# Patient Record
Sex: Female | Born: 1960 | Race: Black or African American | Hispanic: No | Marital: Single | Smoking: Former smoker
Health system: Southern US, Community
[De-identification: ages and names within clinical notes are randomized; demographics above are authoritative.]

## PROBLEM LIST (undated history)

## (undated) DIAGNOSIS — E119 Type 2 diabetes mellitus without complications: Secondary | ICD-10-CM

## (undated) DIAGNOSIS — I10 Essential (primary) hypertension: Secondary | ICD-10-CM

## (undated) HISTORY — PX: ABDOMINAL HYSTERECTOMY: SHX81

## (undated) HISTORY — PX: LYMPHADENECTOMY: SHX15

## (undated) HISTORY — PX: TUBAL LIGATION: SHX77

## (undated) HISTORY — PX: BREAST EXCISIONAL BIOPSY: SUR124

---

## 2017-11-07 ENCOUNTER — Emergency Department
Admission: EM | Admit: 2017-11-07 | Discharge: 2017-11-07 | Disposition: A | Discharge status: Home / Self Care | Payer: BLUE CROSS/BLUE SHIELD | Attending: Emergency Medicine | Admitting: Emergency Medicine

## 2017-11-07 ENCOUNTER — Encounter: Payer: Self-pay | Admitting: Emergency Medicine

## 2017-11-07 ENCOUNTER — Other Ambulatory Visit: Payer: Self-pay

## 2017-11-07 DIAGNOSIS — T161XXA Foreign body in right ear, initial encounter: Secondary | ICD-10-CM

## 2017-11-07 DIAGNOSIS — Y999 Unspecified external cause status: Secondary | ICD-10-CM | POA: Diagnosis not present

## 2017-11-07 DIAGNOSIS — Y929 Unspecified place or not applicable: Secondary | ICD-10-CM | POA: Insufficient documentation

## 2017-11-07 DIAGNOSIS — F17220 Nicotine dependence, chewing tobacco, uncomplicated: Secondary | ICD-10-CM | POA: Insufficient documentation

## 2017-11-07 DIAGNOSIS — Y939 Activity, unspecified: Secondary | ICD-10-CM | POA: Insufficient documentation

## 2017-11-07 DIAGNOSIS — E119 Type 2 diabetes mellitus without complications: Secondary | ICD-10-CM | POA: Insufficient documentation

## 2017-11-07 DIAGNOSIS — X58XXXA Exposure to other specified factors, initial encounter: Secondary | ICD-10-CM | POA: Insufficient documentation

## 2017-11-07 DIAGNOSIS — H9201 Otalgia, right ear: Secondary | ICD-10-CM | POA: Diagnosis present

## 2017-11-07 HISTORY — DX: Type 2 diabetes mellitus without complications: E11.9

## 2017-11-07 NOTE — ED Notes (Signed)
Pt c/o right ear pain today, states she goes to sleep with ear buds every night, states she woke up with one of the rubber pieces missing off the ear bud but does not believe it got stuck in her ear.

## 2017-11-07 NOTE — ED Triage Notes (Signed)
R earache since yesterday. 

## 2017-11-07 NOTE — ED Provider Notes (Signed)
[facility] Emergency Department Provider Note  ____________________________________________  Time seen: Approximately 5:01 PM  I have reviewed the triage vital signs and the nursing notes.   HISTORY  Chief Complaint Otalgia    HPI Madison Simmons is a 57 y.o. female that presents to the emergency department for evaluation of right ear pain for 1 day.  She has been digging in her ear. She had bronchitis about 1 month ago but no other recent illness.  No fever, chills, nasal congestion, cough, shortness of breath.   Past Medical History:  Diagnosis Date  . Diabetes mellitus without complication (HCC)     There are no active problems to display for this patient.   Past Surgical History:  Procedure Laterality Date  . ABDOMINAL HYSTERECTOMY    . lymphectomy    . TUBAL LIGATION      Prior to Admission medications   Not on File    Allergies Patient has no known allergies.  No family history on file.  Social History Social History   Tobacco Use  . Smoking status: Former Games developermoker  . Smokeless tobacco: Current User  Substance Use Topics  . Alcohol use: Not on file  . Drug use: Not on file     Review of Systems  Constitutional: No fever/chills Cardiovascular: No chest pain. Respiratory: No SOB. Gastrointestinal: No nausea, no vomiting.  Musculoskeletal: Negative for musculoskeletal pain. Skin: Negative for rash, abrasions, lacerations, ecchymosis.  ____________________________________________   PHYSICAL EXAM:  VITAL SIGNS: ED Triage Vitals  Enc Vitals Group     BP 11/07/17 1532 (!) 138/58     Pulse Rate 11/07/17 1532 73     Resp 11/07/17 1532 18     Temp 11/07/17 1532 98.4 F (36.9 C)     Temp src --      SpO2 11/07/17 1532 100 %     Weight 11/07/17 1532 277 lb (125.6 kg)     Height 11/07/17 1532 5\' 5"  (1.651 m)     Head Circumference --      Peak Flow --      Pain Score 11/07/17 1531 8     Pain Loc --      Pain Edu? --       Excl. in GC? --      Constitutional: Alert and oriented. Well appearing and in no acute distress. Eyes: Conjunctivae are normal. PERRL. EOMI. Head: Atraumatic. ENT:      Ears: Black item in right ear canal.      Nose: No congestion/rhinnorhea.      Mouth/Throat: Mucous membranes are moist.  Neck: No stridor.   Cardiovascular: Good peripheral circulation. Respiratory: Normal respiratory effort without tachypnea or retractions.  Good air entry to the bases with no decreased or absent breath sounds. Musculoskeletal: Full range of motion to all extremities. No gross deformities appreciated. Neurologic:  Normal speech and language. No gross focal neurologic deficits are appreciated.  Skin:  Skin is warm, dry and intact. No rash noted.   ____________________________________________   LABS (all labs ordered are listed, but only abnormal results are displayed)  Labs Reviewed - No data to display ____________________________________________  EKG   ____________________________________________  RADIOLOGY  No results found.  ____________________________________________    PROCEDURES  Procedure(s) performed:    Procedures  Earbud removed from right ear canal with   Medications - No data to display   ____________________________________________   INITIAL IMPRESSION / ASSESSMENT AND PLAN / ED COURSE  Pertinent labs & imaging results  that were available during my care of the patient were reviewed by me and considered in my medical decision making (see chart for details).  Review of the [place] CSRS was performed in accordance of the [facility] prior to dispensing any controlled drugs.   Patient's diagnosis is consistent with foreign body in ear canal.  Vital signs and exam are reassuring.  No signs of infection after earbud removal.  Patient is to follow up with PCP as directed. Patient is given ED precautions to return to the ED for any worsening or new  symptoms.     ____________________________________________  FINAL CLINICAL IMPRESSION(S) / ED DIAGNOSES  Final diagnoses:  Foreign body of right ear, initial encounter      NEW MEDICATIONS STARTED DURING THIS VISIT:  ED Discharge Orders    None          This chart was dictated using voice recognition software/Dragon. Despite best efforts to proofread, errors can occur which can change the meaning. Any change was purely unintentional.    Enid Derry, PA-C 11/07/17 1722    Jene Every, MD 11/07/17 [phone]

## 2018-01-18 ENCOUNTER — Other Ambulatory Visit: Payer: Self-pay | Admitting: Internal Medicine

## 2018-01-18 DIAGNOSIS — Z1231 Encounter for screening mammogram for malignant neoplasm of breast: Secondary | ICD-10-CM

## 2018-02-09 ENCOUNTER — Ambulatory Visit
Admission: RE | Admit: 2018-02-09 | Discharge: 2018-02-09 | Disposition: A | Discharge status: Home / Self Care | Payer: BLUE CROSS/BLUE SHIELD | Source: Ambulatory Visit | Attending: Internal Medicine | Admitting: Internal Medicine

## 2018-02-09 DIAGNOSIS — Z1231 Encounter for screening mammogram for malignant neoplasm of breast: Secondary | ICD-10-CM | POA: Diagnosis present

## 2018-02-10 ENCOUNTER — Inpatient Hospital Stay
Admission: RE | Admit: 2018-02-10 | Discharge: 2018-02-10 | Disposition: A | Discharge status: Home / Self Care | Payer: Self-pay | Source: Ambulatory Visit | Attending: *Deleted | Admitting: *Deleted

## 2018-02-10 ENCOUNTER — Other Ambulatory Visit: Payer: Self-pay | Admitting: *Deleted

## 2018-02-10 DIAGNOSIS — Z9289 Personal history of other medical treatment: Secondary | ICD-10-CM

## 2019-02-04 ENCOUNTER — Other Ambulatory Visit: Payer: Self-pay

## 2019-02-04 ENCOUNTER — Emergency Department
Admission: EM | Admit: 2019-02-04 | Discharge: 2019-02-04 | Disposition: A | Discharge status: Home / Self Care | Payer: BLUE CROSS/BLUE SHIELD | Attending: Student in an Organized Health Care Education/Training Program | Admitting: Student in an Organized Health Care Education/Training Program

## 2019-02-04 ENCOUNTER — Emergency Department: Payer: BLUE CROSS/BLUE SHIELD

## 2019-02-04 ENCOUNTER — Encounter: Payer: Self-pay | Admitting: Emergency Medicine

## 2019-02-04 DIAGNOSIS — N61 Mastitis without abscess: Secondary | ICD-10-CM

## 2019-02-04 DIAGNOSIS — F17290 Nicotine dependence, other tobacco product, uncomplicated: Secondary | ICD-10-CM | POA: Insufficient documentation

## 2019-02-04 DIAGNOSIS — E119 Type 2 diabetes mellitus without complications: Secondary | ICD-10-CM | POA: Diagnosis not present

## 2019-02-04 DIAGNOSIS — R509 Fever, unspecified: Secondary | ICD-10-CM | POA: Diagnosis present

## 2019-02-04 DIAGNOSIS — N644 Mastodynia: Secondary | ICD-10-CM

## 2019-02-04 LAB — CBC WITH DIFFERENTIAL/PLATELET
Abs Immature Granulocytes: 0.05 10*3/uL (ref 0.00–0.07)
Basophils Absolute: 0 10*3/uL (ref 0.0–0.1)
Basophils Relative: 0 %
Eosinophils Absolute: 0 10*3/uL (ref 0.0–0.5)
Eosinophils Relative: 0 %
HCT: 34.1 % — ABNORMAL LOW (ref 36.0–46.0)
Hemoglobin: 10.8 g/dL — ABNORMAL LOW (ref 12.0–15.0)
Immature Granulocytes: 0 %
Lymphocytes Relative: 9 %
Lymphs Abs: 1 10*3/uL (ref 0.7–4.0)
MCH: 26.9 pg (ref 26.0–34.0)
MCHC: 31.7 g/dL (ref 30.0–36.0)
MCV: 84.8 fL (ref 80.0–100.0)
Monocytes Absolute: 0.6 10*3/uL (ref 0.1–1.0)
Monocytes Relative: 5 %
Neutro Abs: 9.5 10*3/uL — ABNORMAL HIGH (ref 1.7–7.7)
Neutrophils Relative %: 86 %
Platelets: 303 10*3/uL (ref 150–400)
RBC: 4.02 MIL/uL (ref 3.87–5.11)
RDW: 13.5 % (ref 11.5–15.5)
WBC: 11.2 10*3/uL — ABNORMAL HIGH (ref 4.0–10.5)
nRBC: 0 % (ref 0.0–0.2)

## 2019-02-04 LAB — COMPREHENSIVE METABOLIC PANEL
ALT: 13 U/L (ref 0–44)
AST: 26 U/L (ref 15–41)
Albumin: 3.1 g/dL — ABNORMAL LOW (ref 3.5–5.0)
Alkaline Phosphatase: 53 U/L (ref 38–126)
Anion gap: 10 (ref 5–15)
BUN: 10 mg/dL (ref 6–20)
CO2: 22 mmol/L (ref 22–32)
Calcium: 8 mg/dL — ABNORMAL LOW (ref 8.9–10.3)
Chloride: 104 mmol/L (ref 98–111)
Creatinine, Ser: 0.79 mg/dL (ref 0.44–1.00)
GFR calc Af Amer: >60 mL/min (ref >60)
GFR calc non Af Amer: >60 mL/min (ref >60)
Glucose, Bld: 306 mg/dL — ABNORMAL HIGH (ref 70–99)
Potassium: 4.1 mmol/L (ref 3.5–5.1)
Sodium: 136 mmol/L (ref 135–145)
Total Bilirubin: 1 mg/dL (ref 0.3–1.2)
Total Protein: 7 g/dL (ref 6.5–8.1)

## 2019-02-04 MED ORDER — TRAMADOL HCL 50 MG PO TABS: 50.0000 mg | ORAL_TABLET | Freq: Four times a day (QID) | ORAL | 0 refills | Status: AC | PRN

## 2019-02-04 MED ORDER — AMOXICILLIN-POT CLAVULANATE 875-125 MG PO TABS
1.0000 | ORAL_TABLET | Freq: Once | ORAL | Status: AC
  Administered 2019-02-04: 1 via ORAL
  Filled 2019-02-04: qty 1

## 2019-02-04 MED ORDER — AMOXICILLIN-POT CLAVULANATE 875-125 MG PO TABS: 1.0000 | ORAL_TABLET | Freq: Two times a day (BID) | ORAL | 0 refills | Status: AC

## 2019-02-04 NOTE — ED Notes (Signed)
Pt ambulatory to toilet with steady gait noted.  

## 2019-02-04 NOTE — ED Notes (Signed)
US at bedside

## 2019-02-04 NOTE — ED Notes (Signed)
X-ray at bedside

## 2019-02-04 NOTE — ED Triage Notes (Signed)
Pt presents to ED via POV with c/o fever (max temp at home 100.8) cough, chills, HA, and L breast pain, pt states hx of mastitis, states feels the same.

## 2019-02-04 NOTE — ED Provider Notes (Signed)
[facility] Emergency Department Provider Note    First MD Initiated Contact with Patient 02/04/19 1317     (approximate)  I have reviewed the triage vital signs and the nursing notes.   HISTORY  Chief Complaint Fever; Headache; Cough; and Breast Pain    HPI Madison Simmons is a 58 y.o. female with a history of diabetes as well as a history of mastitis of the left breast 1 year ago presents the ER with flulike illness including cough achiness congestion mild headache started on Friday or about the same time she started developing redness around her left breast with mild to moderate pain.  No recent antibiotics.  Is having fevers and chills at home.  No known sick contacts.  Did not have a previous abscess of the left breast.  Nuys any drainage or bleeding from the nipple.    Past Medical History:  Diagnosis Date  . Diabetes mellitus without complication (HCC)    Family History  Problem Relation Age of Onset  . Breast cancer Mother        58's  . Breast cancer Maternal Aunt        80's   Past Surgical History:  Procedure Laterality Date  . ABDOMINAL HYSTERECTOMY    . LYMPHADENECTOMY Left    Around 1995 Neg  . TUBAL LIGATION     There are no active problems to display for this patient.     Prior to Admission medications   Medication Sig Start Date End Date Taking? Authorizing Provider  amoxicillin-clavulanate (AUGMENTIN) 875-125 MG tablet Take 1 tablet by mouth 2 (two) times daily for 7 days. 02/04/19 02/11/19  Willy Eddy, MD  traMADol (ULTRAM) 50 MG tablet Take 1 tablet (50 mg total) by mouth every 6 (six) hours as needed. 02/04/19 02/04/20  Willy Eddy, MD    Allergies Patient has no known allergies.    Social History Social History   Tobacco Use  . Smoking status: Former Smoker    Types: Cigarettes  . Smokeless tobacco: Current User    Types: Snuff  Substance Use Topics  . Alcohol use: Yes    Comment: occ  . Drug use:  Not Currently    Review of Systems Patient denies headaches, rhinorrhea, blurry vision, numbness, shortness of breath, chest pain, edema, cough, abdominal pain, nausea, vomiting, diarrhea, dysuria, fevers, rashes or hallucinations unless otherwise stated above in HPI. ____________________________________________   PHYSICAL EXAM:  VITAL SIGNS: Vitals:   02/04/19 1310  BP: 117/78  Pulse: 78  Resp: 18  Temp: 99.1 F (37.3 C)  SpO2: 98%    Constitutional: Alert and oriented.  Eyes: Conjunctivae are normal.  Head: Atraumatic. Nose: No congestion/rhinnorhea. Mouth/Throat: Mucous membranes are moist.   Neck: No stridor. Painless ROM.  Cardiovascular: Normal rate, regular rhythm. Grossly normal heart sounds.  Good peripheral circulation. Respiratory: Normal respiratory effort.  No retractions. Lungs CTAB. Gastrointestinal: Soft and nontender. No distention. No abdominal bruits. No CVA tenderness. Genitourinary:  Musculoskeletal: No lower extremity tenderness nor edema.  No joint effusions. Neurologic:  Normal speech and language. No gross focal neurologic deficits are appreciated. No facial droop Skin:  Skin is warm, dry and intact.  Left breast has a tender area with some fluctuance at around 1100 with surrounding streaking erythema.  No drainage. Psychiatric: Mood and affect are normal. Speech and behavior are normal.  ____________________________________________   LABS (all labs ordered are listed, but only abnormal results are displayed)  Results for orders placed or  performed during the hospital encounter of 02/04/19 (from the past 24 hour(s))  CBC with Differential/Platelet     Status: Abnormal   Collection Time: 02/04/19  1:47 PM  Result Value Ref Range   WBC 11.2 (H) 4.0 - 10.5 K/uL   RBC 4.02 3.87 - 5.11 MIL/uL   Hemoglobin 10.8 (L) 12.0 - 15.0 g/dL   HCT 70.6 (L) 23.7 - 62.8 %   MCV 84.8 80.0 - 100.0 fL   MCH 26.9 26.0 - 34.0 pg   MCHC 31.7 30.0 - 36.0 g/dL   RDW  31.5 17.6 - 16.0 %   Platelets 303 150 - 400 K/uL   nRBC 0.0 0.0 - 0.2 %   Neutrophils Relative % 86 %   Neutro Abs 9.5 (H) 1.7 - 7.7 K/uL   Lymphocytes Relative 9 %   Lymphs Abs 1.0 0.7 - 4.0 K/uL   Monocytes Relative 5 %   Monocytes Absolute 0.6 0.1 - 1.0 K/uL   Eosinophils Relative 0 %   Eosinophils Absolute 0.0 0.0 - 0.5 K/uL   Basophils Relative 0 %   Basophils Absolute 0.0 0.0 - 0.1 K/uL   Immature Granulocytes 0 %   Abs Immature Granulocytes 0.05 0.00 - 0.07 K/uL  Comprehensive metabolic panel     Status: Abnormal   Collection Time: 02/04/19  1:47 PM  Result Value Ref Range   Sodium 136 135 - 145 mmol/L   Potassium 4.1 3.5 - 5.1 mmol/L   Chloride 104 98 - 111 mmol/L   CO2 22 22 - 32 mmol/L   Glucose, Bld 306 (H) 70 - 99 mg/dL   BUN 10 6 - 20 mg/dL   Creatinine, Ser 7.37 0.44 - 1.00 mg/dL   Calcium 8.0 (L) 8.9 - 10.3 mg/dL   Total Protein 7.0 6.5 - 8.1 g/dL   Albumin 3.1 (L) 3.5 - 5.0 g/dL   AST 26 15 - 41 U/L   ALT 13 0 - 44 U/L   Alkaline Phosphatase 53 38 - 126 U/L   Total Bilirubin 1.0 0.3 - 1.2 mg/dL   GFR calc non Af Amer >60 >60 mL/min   GFR calc Af Amer >60 >60 mL/min   Anion gap 10 5 - 15   ____________________________________________ ___________________________________  RADIOLOGY  I personally reviewed all radiographic images ordered to evaluate for the above acute complaints and reviewed radiology reports and findings.  These findings were personally discussed with the patient.  Please see medical record for radiology report.  ____________________________________________   PROCEDURES  Procedure(s) performed:  Procedures    Critical Care performed: no ____________________________________________   INITIAL IMPRESSION / ASSESSMENT AND PLAN / ED COURSE  Pertinent labs & imaging results that were available during my care of the patient were reviewed by me and considered in my medical decision making (see chart for details).   DDX: abscess,  cellulitis, ili, covid 19, sepsis, electrolyte, pna  Madison Simmons is a 58 y.o. who presents to the ED with symptoms as described above.  Patient nontoxic-appearing.  Exam performed with nurse Victorino Dike chaperone.  Shows evidence of acute mastitis of the left breast.  Ultrasound ordered to evaluate for underlying abscess.  There is no evidence of abscess or fluid collection on ultrasound.  Blood work is fairly reassuring.  At this point do believe she stable and appropriate for outpatient follow-up.  She tolerated oral antibiotics.  Discussed signs and symptoms for which she should return to the ER.     The patient was evaluated in  Emergency Department today for the symptoms described in the history of present illness. He/she was evaluated in the context of the global COVID-19 pandemic, which necessitated consideration that the patient might be at risk for infection with the SARS-CoV-2 virus that causes COVID-19. Institutional protocols and algorithms that pertain to the evaluation of patients at risk for COVID-19 are in a state of rapid change based on information released by regulatory bodies including the CDC and federal and state organizations. These policies and algorithms were followed during the patient's care in the ED.  As part of my medical decision making, I reviewed the following data within the electronic MEDICAL RECORD NUMBER Nursing notes reviewed and incorporated, Labs reviewed, notes from prior ED visits and [place] Controlled Substance Database   ____________________________________________   FINAL CLINICAL IMPRESSION(S) / ED DIAGNOSES  Final diagnoses:  Breast pain in female  Mastitis, left, acute      NEW MEDICATIONS STARTED DURING THIS VISIT:  New Prescriptions   AMOXICILLIN-CLAVULANATE (AUGMENTIN) 875-125 MG TABLET    Take 1 tablet by mouth 2 (two) times daily for 7 days.   TRAMADOL (ULTRAM) 50 MG TABLET    Take 1 tablet (50 mg total) by mouth every 6 (six) hours as needed.      Note:  This document was prepared using Dragon voice recognition software and may include unintentional dictation errors.    Willy Eddy, MD 02/04/19 [phone]

## 2019-04-17 ENCOUNTER — Other Ambulatory Visit: Payer: Self-pay | Admitting: Internal Medicine

## 2019-04-17 DIAGNOSIS — Z1231 Encounter for screening mammogram for malignant neoplasm of breast: Secondary | ICD-10-CM

## 2019-05-23 ENCOUNTER — Ambulatory Visit
Admission: RE | Admit: 2019-05-23 | Discharge: 2019-05-23 | Disposition: A | Discharge status: Home / Self Care | Payer: BC Managed Care – PPO | Source: Ambulatory Visit | Attending: Internal Medicine | Admitting: Internal Medicine

## 2019-05-23 DIAGNOSIS — Z1231 Encounter for screening mammogram for malignant neoplasm of breast: Secondary | ICD-10-CM | POA: Diagnosis present

## 2019-11-21 ENCOUNTER — Other Ambulatory Visit: Payer: Self-pay

## 2019-11-21 ENCOUNTER — Encounter: Payer: Self-pay | Admitting: Emergency Medicine

## 2019-11-21 ENCOUNTER — Emergency Department: Payer: BC Managed Care – PPO

## 2019-11-21 ENCOUNTER — Emergency Department
Admission: EM | Admit: 2019-11-21 | Discharge: 2019-11-21 | Disposition: A | Discharge status: Home / Self Care | Payer: BC Managed Care – PPO | Attending: Emergency Medicine | Admitting: Emergency Medicine

## 2019-11-21 DIAGNOSIS — Z87891 Personal history of nicotine dependence: Secondary | ICD-10-CM | POA: Diagnosis not present

## 2019-11-21 DIAGNOSIS — E119 Type 2 diabetes mellitus without complications: Secondary | ICD-10-CM | POA: Diagnosis not present

## 2019-11-21 DIAGNOSIS — R079 Chest pain, unspecified: Secondary | ICD-10-CM

## 2019-11-21 DIAGNOSIS — R0789 Other chest pain: Secondary | ICD-10-CM | POA: Diagnosis not present

## 2019-11-21 LAB — CBC
HCT: 38.1 % (ref 36.0–46.0)
Hemoglobin: 12.1 g/dL (ref 12.0–15.0)
MCH: 27 pg (ref 26.0–34.0)
MCHC: 31.8 g/dL (ref 30.0–36.0)
MCV: 85 fL (ref 80.0–100.0)
Platelets: 360 10*3/uL (ref 150–400)
RBC: 4.48 MIL/uL (ref 3.87–5.11)
RDW: 13.7 % (ref 11.5–15.5)
WBC: 6.9 10*3/uL (ref 4.0–10.5)
nRBC: 0 % (ref 0.0–0.2)

## 2019-11-21 LAB — TROPONIN I (HIGH SENSITIVITY)
Troponin I (High Sensitivity): 3 ng/L (ref <18)
Troponin I (High Sensitivity): 3 ng/L (ref <18)

## 2019-11-21 LAB — BASIC METABOLIC PANEL
Anion gap: 11 (ref 5–15)
BUN: 18 mg/dL (ref 6–20)
CO2: 24 mmol/L (ref 22–32)
Calcium: 9.3 mg/dL (ref 8.9–10.3)
Chloride: 101 mmol/L (ref 98–111)
Creatinine, Ser: 0.88 mg/dL (ref 0.44–1.00)
GFR calc Af Amer: >60 mL/min (ref >60)
GFR calc non Af Amer: >60 mL/min (ref >60)
Glucose, Bld: 268 mg/dL — ABNORMAL HIGH (ref 70–99)
Potassium: 4.5 mmol/L (ref 3.5–5.1)
Sodium: 136 mmol/L (ref 135–145)

## 2019-11-21 NOTE — ED Provider Notes (Signed)
[facility] Emergency Department Provider Note  Time seen: 7:07 PM  I have reviewed the triage vital signs and the nursing notes.   HISTORY  Chief Complaint Chest Pain   HPI Madison Simmons is a 59 y.o. female with a past medical history of diabetes who presents to the emergency department for chest pain.  According to the patient since yesterday she has been experiencing intermittent pinches of pain in the center of her chest.  States they will only last seconds at a time and then go away.  Patient denies any nausea diaphoresis or shortness of breath.  Denies any cough congestion or fever.  Patient denies any chest pain currently.  No pleuritic pain.    Past Medical History:  Diagnosis Date  . Diabetes mellitus without complication (HCC)     There are no problems to display for this patient.   Past Surgical History:  Procedure Laterality Date  . ABDOMINAL HYSTERECTOMY    . BREAST EXCISIONAL BIOPSY    . LYMPHADENECTOMY Left    Around 1995 Neg  . TUBAL LIGATION      Prior to Admission medications   Medication Sig Start Date End Date Taking? Authorizing Provider  traMADol (ULTRAM) 50 MG tablet Take 1 tablet (50 mg total) by mouth every 6 (six) hours as needed. 02/04/19 02/04/20  Willy Eddy, MD    No Known Allergies  Family History  Problem Relation Age of Onset  . Breast cancer Mother        48's  . Breast cancer Maternal Aunt        28's    Social History Social History   Tobacco Use  . Smoking status: Former Smoker    Types: Cigarettes  . Smokeless tobacco: Current User    Types: Snuff  Substance Use Topics  . Alcohol use: Yes    Comment: occ  . Drug use: Not Currently    Review of Systems Constitutional: Negative for fever Cardiovascular: Intermittent sharp chest pain x2 days, none currently Respiratory: Negative for shortness of breath. Gastrointestinal: Negative for abdominal pain Musculoskeletal: Negative for  musculoskeletal complaints Neurological: Negative for headache All other ROS negative  ____________________________________________   PHYSICAL EXAM:  VITAL SIGNS: ED Triage Vitals [11/21/19 1511]  Enc Vitals Group     BP (!) 144/77     Pulse Rate (!) 54     Resp 16     Temp 98.5 F (36.9 C)     Temp Source Oral     SpO2 98 %     Weight 283 lb (128.4 kg)     Height 5\' 5"  (1.651 m)     Head Circumference      Peak Flow      Pain Score 7     Pain Loc      Pain Edu?      Excl. in GC?    Constitutional: Alert and oriented. Well appearing and in no distress. Eyes: Normal exam ENT      Head: Normocephalic and atraumatic.      Mouth/Throat: Mucous membranes are moist. Cardiovascular: Normal rate, regular rhythm. No murmur Respiratory: Normal respiratory effort without tachypnea nor retractions. Breath sounds are clear  Gastrointestinal: Soft and nontender. No distention. Musculoskeletal: Nontender with normal range of motion in all extremities. Neurologic:  Normal speech and language. No gross focal neurologic deficits  Skin:  Skin is warm, dry and intact.  Psychiatric: Mood and affect are normal.   ____________________________________________    EKG  EKG viewed and interpreted by myself shows a sinus bradycardia 58 bpm with a narrow QRS, normal axis, normal intervals, no concerning ST changes.  ____________________________________________    RADIOLOGY  Chest x-ray is negative.  ____________________________________________   INITIAL IMPRESSION / ASSESSMENT AND PLAN / ED COURSE  Pertinent labs & imaging results that were available during my care of the patient were reviewed by me and considered in my medical decision making (see chart for details).   Patient presents to the emergency department for intermittent pinches of chest pain to the center of her chest.  No chest pain currently.  Overall the patient appears well, normal physical exam, reassuring EKG, chest  x-ray is negative.  Lab work is reassuring as well with a negative troponin.  Mild hyperglycemia for which the patient takes glyburide.  Given a negative troponin x-ray and reassuring EKG with symptoms ongoing intermittently for 2 days and symptom-free currently, we will discharge with PCP follow-up.  Patient agreeable to plan of care.  Provided my normal chest pain return precautions.  Madison Simmons was evaluated in Emergency Department on 11/21/2019 for the symptoms described in the history of present illness. She was evaluated in the context of the global COVID-19 pandemic, which necessitated consideration that the patient might be at risk for infection with the SARS-CoV-2 virus that causes COVID-19. Institutional protocols and algorithms that pertain to the evaluation of patients at risk for COVID-19 are in a state of rapid change based on information released by regulatory bodies including the CDC and federal and state organizations. These policies and algorithms were followed during the patient's care in the ED.  ____________________________________________   FINAL CLINICAL IMPRESSION(S) / ED DIAGNOSES  Nonspecific chest pain   Harvest Dark, MD 11/21/19 1913

## 2019-11-21 NOTE — ED Triage Notes (Addendum)
Patient reports intermittent chest pain since yesterday. States this morning when she got up for work, the pain was significantly worse. Patient denies dizziness or SOB.   Patient said she was had a positive COVID result on 12/21

## 2019-11-29 LAB — HM MAMMOGRAPHY

## 2020-08-06 ENCOUNTER — Other Ambulatory Visit: Payer: Self-pay | Admitting: Internal Medicine

## 2020-08-06 DIAGNOSIS — Z1231 Encounter for screening mammogram for malignant neoplasm of breast: Secondary | ICD-10-CM

## 2020-09-03 ENCOUNTER — Other Ambulatory Visit: Payer: Self-pay

## 2020-09-03 ENCOUNTER — Ambulatory Visit
Admission: RE | Admit: 2020-09-03 | Discharge: 2020-09-03 | Disposition: A | Discharge status: Home / Self Care | Payer: No Typology Code available for payment source | Source: Ambulatory Visit | Attending: Internal Medicine | Admitting: Internal Medicine

## 2020-09-03 DIAGNOSIS — Z1231 Encounter for screening mammogram for malignant neoplasm of breast: Secondary | ICD-10-CM | POA: Diagnosis not present

## 2021-06-24 ENCOUNTER — Other Ambulatory Visit: Payer: Self-pay | Admitting: Internal Medicine

## 2021-06-24 DIAGNOSIS — Z1231 Encounter for screening mammogram for malignant neoplasm of breast: Secondary | ICD-10-CM

## 2021-09-07 ENCOUNTER — Other Ambulatory Visit: Payer: Self-pay

## 2021-09-07 ENCOUNTER — Ambulatory Visit
Admission: RE | Admit: 2021-09-07 | Discharge: 2021-09-07 | Disposition: A | Discharge status: Home / Self Care | Payer: BLUE CROSS/BLUE SHIELD | Source: Ambulatory Visit | Attending: Internal Medicine | Admitting: Internal Medicine

## 2021-09-07 DIAGNOSIS — Z1231 Encounter for screening mammogram for malignant neoplasm of breast: Secondary | ICD-10-CM

## 2021-11-23 DIAGNOSIS — K219 Gastro-esophageal reflux disease without esophagitis: Secondary | ICD-10-CM | POA: Diagnosis not present

## 2021-11-23 DIAGNOSIS — I10 Essential (primary) hypertension: Secondary | ICD-10-CM | POA: Diagnosis not present

## 2021-11-23 DIAGNOSIS — E7800 Pure hypercholesterolemia, unspecified: Secondary | ICD-10-CM | POA: Diagnosis not present

## 2021-11-23 DIAGNOSIS — E1165 Type 2 diabetes mellitus with hyperglycemia: Secondary | ICD-10-CM | POA: Diagnosis not present

## 2021-11-24 DIAGNOSIS — K219 Gastro-esophageal reflux disease without esophagitis: Secondary | ICD-10-CM | POA: Diagnosis not present

## 2021-11-24 DIAGNOSIS — E7800 Pure hypercholesterolemia, unspecified: Secondary | ICD-10-CM | POA: Diagnosis not present

## 2021-11-24 DIAGNOSIS — I10 Essential (primary) hypertension: Secondary | ICD-10-CM | POA: Diagnosis not present

## 2021-11-24 DIAGNOSIS — E1165 Type 2 diabetes mellitus with hyperglycemia: Secondary | ICD-10-CM | POA: Diagnosis not present

## 2021-11-30 DIAGNOSIS — Z0000 Encounter for general adult medical examination without abnormal findings: Secondary | ICD-10-CM | POA: Diagnosis not present

## 2021-11-30 DIAGNOSIS — M79622 Pain in left upper arm: Secondary | ICD-10-CM | POA: Diagnosis not present

## 2021-11-30 DIAGNOSIS — I10 Essential (primary) hypertension: Secondary | ICD-10-CM | POA: Diagnosis not present

## 2022-03-11 DIAGNOSIS — Z86010 Personal history of colonic polyps: Secondary | ICD-10-CM | POA: Diagnosis not present

## 2022-03-18 DIAGNOSIS — E7800 Pure hypercholesterolemia, unspecified: Secondary | ICD-10-CM | POA: Diagnosis not present

## 2022-03-18 DIAGNOSIS — I10 Essential (primary) hypertension: Secondary | ICD-10-CM | POA: Diagnosis not present

## 2022-03-18 DIAGNOSIS — M79622 Pain in left upper arm: Secondary | ICD-10-CM | POA: Diagnosis not present

## 2022-03-18 DIAGNOSIS — R69 Illness, unspecified: Secondary | ICD-10-CM | POA: Diagnosis not present

## 2022-03-18 DIAGNOSIS — Z0000 Encounter for general adult medical examination without abnormal findings: Secondary | ICD-10-CM | POA: Diagnosis not present

## 2022-03-18 DIAGNOSIS — Z6841 Body Mass Index (BMI) 40.0 and over, adult: Secondary | ICD-10-CM | POA: Diagnosis not present

## 2022-03-18 DIAGNOSIS — E1165 Type 2 diabetes mellitus with hyperglycemia: Secondary | ICD-10-CM | POA: Diagnosis not present

## 2022-04-01 ENCOUNTER — Other Ambulatory Visit: Payer: Self-pay | Admitting: Internal Medicine

## 2022-04-01 ENCOUNTER — Ambulatory Visit
Admission: RE | Admit: 2022-04-01 | Discharge: 2022-04-01 | Disposition: A | Discharge status: Home / Self Care | Payer: 59 | Source: Ambulatory Visit | Attending: Internal Medicine | Admitting: Internal Medicine

## 2022-04-01 DIAGNOSIS — K219 Gastro-esophageal reflux disease without esophagitis: Secondary | ICD-10-CM | POA: Diagnosis not present

## 2022-04-01 DIAGNOSIS — M7989 Other specified soft tissue disorders: Secondary | ICD-10-CM | POA: Insufficient documentation

## 2022-04-01 DIAGNOSIS — M79605 Pain in left leg: Secondary | ICD-10-CM | POA: Diagnosis not present

## 2022-04-01 DIAGNOSIS — Z6841 Body Mass Index (BMI) 40.0 and over, adult: Secondary | ICD-10-CM | POA: Diagnosis not present

## 2022-04-01 DIAGNOSIS — I83813 Varicose veins of bilateral lower extremities with pain: Secondary | ICD-10-CM | POA: Diagnosis not present

## 2022-04-01 DIAGNOSIS — I10 Essential (primary) hypertension: Secondary | ICD-10-CM | POA: Diagnosis not present

## 2022-04-01 DIAGNOSIS — Z720 Tobacco use: Secondary | ICD-10-CM | POA: Diagnosis not present

## 2022-04-01 DIAGNOSIS — R69 Illness, unspecified: Secondary | ICD-10-CM | POA: Diagnosis not present

## 2022-04-01 DIAGNOSIS — M79672 Pain in left foot: Secondary | ICD-10-CM | POA: Diagnosis not present

## 2022-04-29 DIAGNOSIS — H40003 Preglaucoma, unspecified, bilateral: Secondary | ICD-10-CM | POA: Diagnosis not present

## 2022-06-11 ENCOUNTER — Ambulatory Visit: Admission: RE | Admit: 2022-06-11 | Payer: 59 | Source: Home / Self Care

## 2022-06-11 ENCOUNTER — Encounter: Admission: RE | Payer: Self-pay | Source: Home / Self Care

## 2022-06-11 SURGERY — COLONOSCOPY WITH PROPOFOL
Anesthesia: General

## 2022-07-28 ENCOUNTER — Other Ambulatory Visit: Payer: Self-pay | Admitting: Internal Medicine

## 2022-07-28 DIAGNOSIS — Z1231 Encounter for screening mammogram for malignant neoplasm of breast: Secondary | ICD-10-CM

## 2022-09-09 ENCOUNTER — Ambulatory Visit
Admission: RE | Admit: 2022-09-09 | Discharge: 2022-09-09 | Disposition: A | Discharge status: Home / Self Care | Payer: 59 | Source: Ambulatory Visit | Attending: Internal Medicine | Admitting: Internal Medicine

## 2022-09-09 DIAGNOSIS — Z1231 Encounter for screening mammogram for malignant neoplasm of breast: Secondary | ICD-10-CM

## 2022-10-11 ENCOUNTER — Emergency Department
Admission: EM | Admit: 2022-10-11 | Discharge: 2022-10-11 | Disposition: A | Discharge status: Home / Self Care | Payer: 59 | Attending: Emergency Medicine | Admitting: Emergency Medicine

## 2022-10-11 ENCOUNTER — Other Ambulatory Visit: Payer: Self-pay

## 2022-10-11 DIAGNOSIS — J101 Influenza due to other identified influenza virus with other respiratory manifestations: Secondary | ICD-10-CM | POA: Diagnosis not present

## 2022-10-11 DIAGNOSIS — R059 Cough, unspecified: Secondary | ICD-10-CM | POA: Diagnosis present

## 2022-10-11 DIAGNOSIS — Z1152 Encounter for screening for COVID-19: Secondary | ICD-10-CM | POA: Insufficient documentation

## 2022-10-11 LAB — RESP PANEL BY RT-PCR (FLU A&B, COVID) ARPGX2
Influenza A by PCR: POSITIVE — AB
Influenza B by PCR: NEGATIVE
SARS Coronavirus 2 by RT PCR: NEGATIVE

## 2022-10-11 MED ORDER — OSELTAMIVIR PHOSPHATE 75 MG PO CAPS: 75.0000 mg | ORAL_CAPSULE | Freq: Two times a day (BID) | ORAL | 0 refills | Status: AC

## 2022-10-11 MED ORDER — BENZONATATE 100 MG PO CAPS: 100.0000 mg | ORAL_CAPSULE | Freq: Four times a day (QID) | ORAL | 0 refills | Status: AC | PRN

## 2022-10-11 NOTE — ED Provider Notes (Signed)
   [facility] Provider Note    Event Date/Time   First MD Initiated Contact with Patient 10/11/22 1025     (approximate)   History   Sore Throat and Cough   HPI  Madison Simmons is a 61 y.o. female who presents with complaints of sore throat, cough, body aches, fatigue, headaches.  She reports the symptoms started yesterday.  No shortness of breath.     Physical Exam   Triage Vital Signs: ED Triage Vitals  Enc Vitals Group     BP 10/11/22 0932 (!) 141/75     Pulse Rate 10/11/22 0932 70     Resp 10/11/22 0932 16     Temp 10/11/22 0936 98.4 F (36.9 C)     Temp Source 10/11/22 0936 Oral     SpO2 10/11/22 0932 97 %     Weight 10/11/22 0934 122 kg (269 lb)     Height 10/11/22 0934 1.651 m (5\' 5" )     Head Circumference --      Peak Flow --      Pain Score 10/11/22 0934 3     Pain Loc --      Pain Edu? --      Excl. in GC? --     Most recent vital signs: Vitals:   10/11/22 0932 10/11/22 0936  BP: (!) 141/75   Pulse: 70   Resp: 16   Temp:  98.4 F (36.9 C)  SpO2: 97%      General: Awake, no distress.  CV:  Good peripheral perfusion.  Resp:  Normal effort.  Clear to auscultation bilaterally Abd:  No distention.  Other:     ED Results / Procedures / Treatments   Labs (all labs ordered are listed, but only abnormal results are displayed) Labs Reviewed  RESP PANEL BY RT-PCR (FLU A&B, COVID) ARPGX2 - Abnormal; Notable for the following components:      Result Value   Influenza A by PCR POSITIVE (*)    All other components within normal limits     EKG     RADIOLOGY     PROCEDURES:  Critical Care performed:   Procedures   MEDICATIONS ORDERED IN ED: Medications - No data to display   IMPRESSION / MDM / ASSESSMENT AND PLAN / ED COURSE  I reviewed the triage vital signs and the nursing notes. Patient's presentation is most consistent with acute complicated illness / injury requiring diagnostic workup.  Patient  presents with symptoms consistent with viral illness.  Differential includes COVID, influenza, RSV, other viral illness  PCR positive for influenza A, after discussion with patient we will start Tamiflu.  No indication for admission        FINAL CLINICAL IMPRESSION(S) / ED DIAGNOSES   Final diagnoses:  Influenza A     Rx / DC Orders   ED Discharge Orders          Ordered    oseltamivir (TAMIFLU) 75 MG capsule  2 times daily        10/11/22 1035    benzonatate (TESSALON PERLES) 100 MG capsule  Every 6 hours PRN        10/11/22 1035             Note:  This document was prepared using Dragon voice recognition software and may include unintentional dictation errors.   14/11/23, MD 10/11/22 1306

## 2022-10-11 NOTE — ED Triage Notes (Signed)
Pt to ED via POV for cough, congestion, sore throat, and headache that started yesterday. Pt is in NAD.

## 2022-12-13 ENCOUNTER — Emergency Department: Payer: 59

## 2022-12-13 ENCOUNTER — Emergency Department
Admission: EM | Admit: 2022-12-13 | Discharge: 2022-12-13 | Disposition: A | Discharge status: Home / Self Care | Payer: 59 | Attending: Emergency Medicine | Admitting: Emergency Medicine

## 2022-12-13 ENCOUNTER — Other Ambulatory Visit: Payer: Self-pay

## 2022-12-13 DIAGNOSIS — E119 Type 2 diabetes mellitus without complications: Secondary | ICD-10-CM | POA: Insufficient documentation

## 2022-12-13 DIAGNOSIS — R202 Paresthesia of skin: Secondary | ICD-10-CM | POA: Insufficient documentation

## 2022-12-13 DIAGNOSIS — M5412 Radiculopathy, cervical region: Secondary | ICD-10-CM

## 2022-12-13 MED ORDER — HYDROCODONE-ACETAMINOPHEN 5-325 MG PO TABS: 1.0000 | ORAL_TABLET | Freq: Four times a day (QID) | ORAL | 0 refills | Status: DC | PRN

## 2022-12-13 MED ORDER — HYDROCODONE-ACETAMINOPHEN 5-325 MG PO TABS: 1.0000 | ORAL_TABLET | Freq: Four times a day (QID) | ORAL | 0 refills | Status: AC | PRN

## 2022-12-13 MED ORDER — PREDNISONE 20 MG PO TABS
60.0000 mg | ORAL_TABLET | Freq: Once | ORAL | Status: AC
  Administered 2022-12-13: 60 mg via ORAL
  Filled 2022-12-13: qty 3

## 2022-12-13 MED ORDER — PREDNISONE 20 MG PO TABS: 40.0000 mg | ORAL_TABLET | Freq: Every day | ORAL | 0 refills | Status: DC

## 2022-12-13 MED ORDER — PREDNISONE 20 MG PO TABS: 40.0000 mg | ORAL_TABLET | Freq: Every day | ORAL | 0 refills | Status: AC

## 2022-12-13 MED ORDER — KETOROLAC TROMETHAMINE 60 MG/2ML IM SOLN
60.0000 mg | Freq: Once | INTRAMUSCULAR | Status: AC
  Administered 2022-12-13: 60 mg via INTRAMUSCULAR
  Filled 2022-12-13: qty 2

## 2022-12-13 NOTE — ED Triage Notes (Signed)
C/O left lower arm pain since Thursday.  Also upper left arm pain and tingling to fingers and numbness.  AAOx3.  Skin warm and dry. NAD  MAE equally and strong.

## 2022-12-13 NOTE — ED Notes (Signed)
Pt discharge to home. Pt VSS, GCS 15, NAD. Pt verbalized understanding of discharge instructions with no additional questions at this time.

## 2022-12-13 NOTE — ED Notes (Signed)
Pt arrived with complaint of tingling sensation in left hand/fingers as well as arm pain. Pt denies any sensation changes in her legs. Pt grip strength strong bilaterally. Pt denies CP, N/V/D, fevers.

## 2022-12-13 NOTE — ED Notes (Signed)
Patient transported to X-ray 

## 2022-12-13 NOTE — ED Provider Notes (Signed)
[facility] Provider Note    Event Date/Time   First MD Initiated Contact with Patient 12/13/22 [phone]     (approximate)   History   No chief complaint on file.   HPI  Madison Simmons is a 62 y.o. female  here with left arm pain/numbness and tingling. Pt reports that for the past week she has had fairly constant, slightly worsening tingling and numbness in her left arm and hand. Pain radiates from her neck down her arm to her hand, worse in her 1-3rd fingers. Reports she has had no weakness. Pain is worse with certain movements. No recent falls or injuries. She did have influenza several weeks ago with a lingering cough. No LE sx. No CP. No SOB.       Physical Exam   Triage Vital Signs: ED Triage Vitals  Enc Vitals Group     BP --      Pulse --      Resp --      Temp --      Temp src --      SpO2 --      Weight 12/13/22 0852 268 lb 15.4 oz (122 kg)     Height 12/13/22 0852 5' 2"$  (1.575 m)     Head Circumference --      Peak Flow --      Pain Score 12/13/22 0851 7     Pain Loc --      Pain Edu? --      Excl. in [place]? --     Most recent vital signs: Vitals:   12/13/22 0900  BP: (!) 163/83  Pulse: 64  Resp: 20  Temp: 97.7 F (36.5 C)  SpO2: 99%     General: Awake, no distress.  CV:  Good peripheral perfusion. RRR. Resp:  Normal effort. Lungs clear bilaterally. Abd:  No distention. No tenderness. Other:  Moderate paracervical TTP left cervical spine, with +paresthesias along C5-7 distributions. Strength 5/5 distal grip strength and hand movements, wrist flexion/extension. Sensation intact to light touch. Radial pulse 2+ and symmetric. Strength 5/5 throughout remainder. CN intact. No other focal findings.   ED Results / Procedures / Treatments   Labs (all labs ordered are listed, but only abnormal results are displayed) Labs Reviewed - No data to display   EKG Normal sinus rhythm, ventricular rate 66.  PR 161, QRS 103, QTc 479.  No  acute ST elevations or depressions.  No acute evidence of acute ischemia or infarct.   RADIOLOGY DG neck: Degenerative disc disease at C5-6, no acute bony findings CXR: No acute abnormality   I also independently reviewed and agree with radiologist interpretations.   PROCEDURES:  Critical Care performed: No   MEDICATIONS ORDERED IN ED: Medications  predniSONE (DELTASONE) tablet 60 mg (60 mg Oral Given 12/13/22 0923)  ketorolac (TORADOL) injection 60 mg (60 mg Intramuscular Given 12/13/22 0924)     IMPRESSION / MDM / [place] / ED COURSE  I reviewed the triage vital signs and the nursing notes.                              Differential diagnosis includes, but is not limited to, cervical radiculopathy, peripheral neuropathy, cubital tunnel syndrome, musculoskeletal pain, unlikely referred cardiac or pulmonary pain, diabetic neuropathy.  Patient's presentation is most consistent with acute presentation with potential threat to life or bodily function.  62 yo F with  PMHx DM, prior hysterectomy, here with left arm pain and tingling. Clinically, high suspicion for cervical radiculopathy versus cubital tunnel syndrome, though upper arm sx are more c/w cervical issue. +Spurling's on left. No distal vascular compromise and strength is fully intact distally. EKG nonischemic, no signs or sx to suggest referred cardiac or pulm sx. Will place on steroids, analgesia. DG Neck and CXR obtained and reviewed by me, show degenerative disc disease at C5-6 which correlates with her symptoms.  Chest x-ray is clear.  She has chronic right pleural thickening which is unchanged.  Will treat the patient with course of steroids for acute radiculopathy, analgesia, and outpatient follow-up.  Return precautions given.  FINAL CLINICAL IMPRESSION(S) / ED DIAGNOSES   Final diagnoses:  Cervical radiculopathy     Rx / DC Orders   ED Discharge Orders          Ordered    predniSONE (DELTASONE) 20  MG tablet  Daily        12/13/22 1052    HYDROcodone-acetaminophen (NORCO/VICODIN) 5-325 MG tablet  Every 6 hours PRN        12/13/22 1052             Note:  This document was prepared using Dragon voice recognition software and may include unintentional dictation errors.   Duffy Bruce, MD 12/13/22 1053

## 2022-12-13 NOTE — Discharge Instructions (Signed)
Take over-the-counter Ibuprofen and Tylenol for mild pain  Take the steroid as prescribed.  Take the Norco for severe pain.  Avoid any heavy lifting or twisting for the next 7 to 10 days.  You can apply heat or a warm pad to the area to help with some of the stiffness  Follow-up with your doctor in 1 week if symptoms do not improve.  You may benefit from outpatient referral to a spine specialist or further imaging as an outpatient.

## 2023-01-29 IMAGING — MG MM DIGITAL SCREENING BILAT W/ TOMO AND CAD
8 series · 8 of 24 positions shown · non-contrast
Comparison: Previous exam(s).

CLINICAL DATA: Screening.

EXAM:
DIGITAL SCREENING BILATERAL MAMMOGRAM WITH TOMOSYNTHESIS AND CAD
TECHNIQUE: Bilateral screening digital craniocaudal and mediolateral oblique
mammograms were obtained. Bilateral screening digital breast
tomosynthesis was performed. The images were evaluated with
computer-aided detection.

[R CC synth-2D]
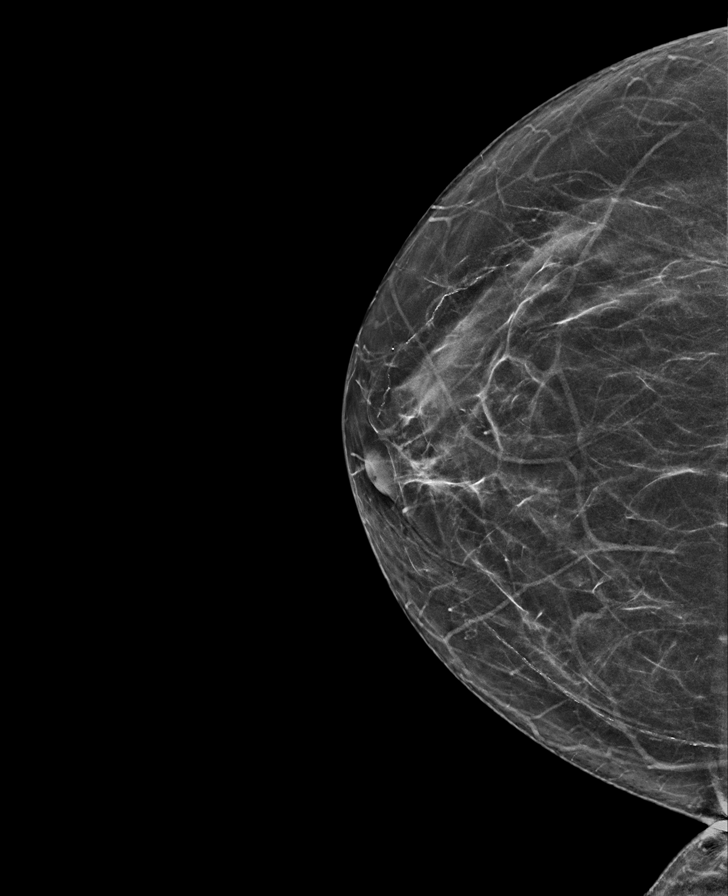

[L MLO synth-2D]
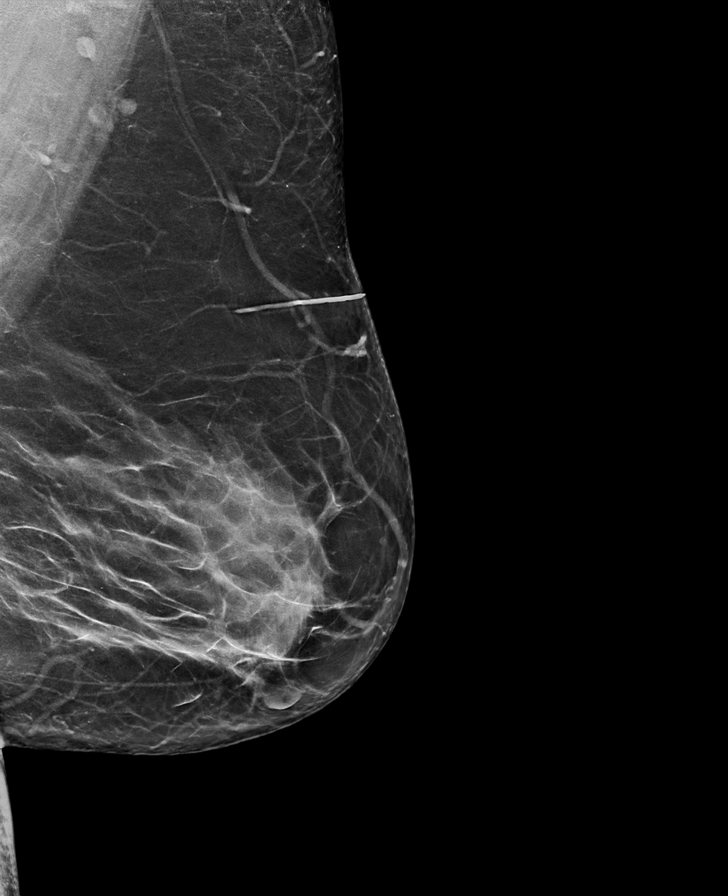

[L CC synth-2D]
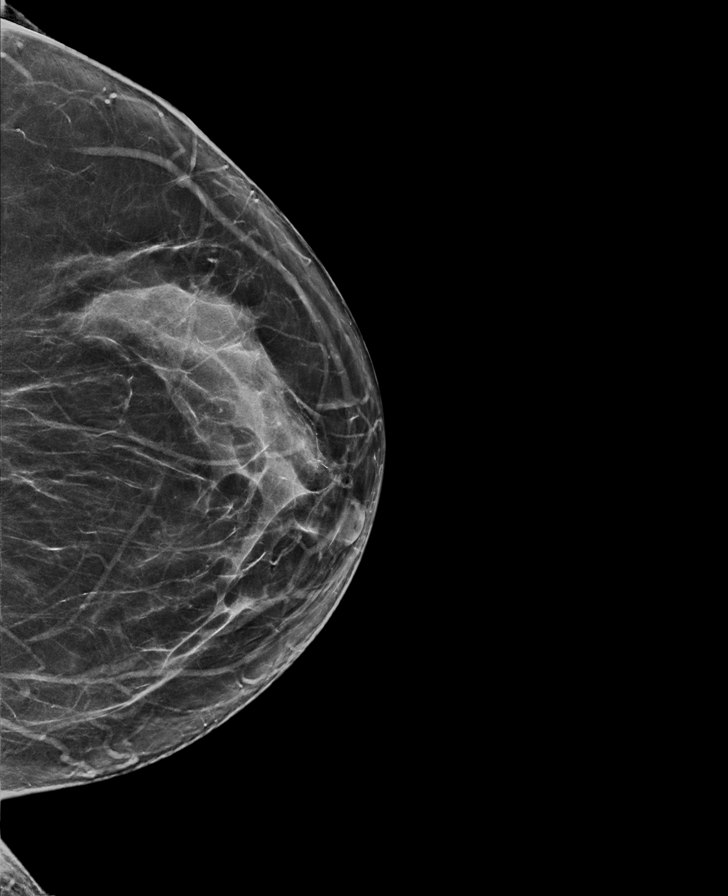

[R MLO synth-2D]
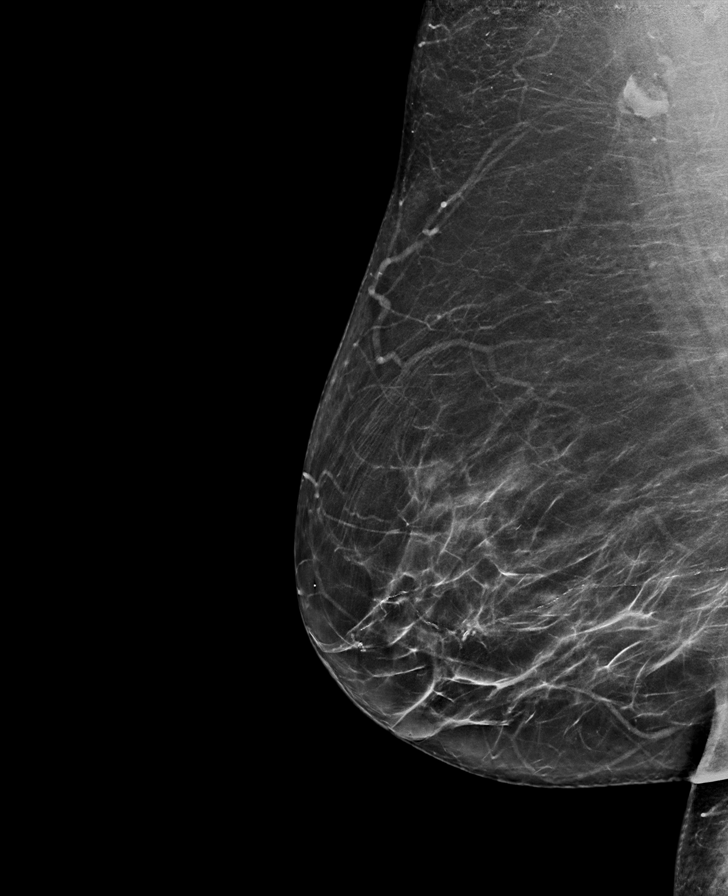

[L CC tomo · tomo slice 38/75.0]
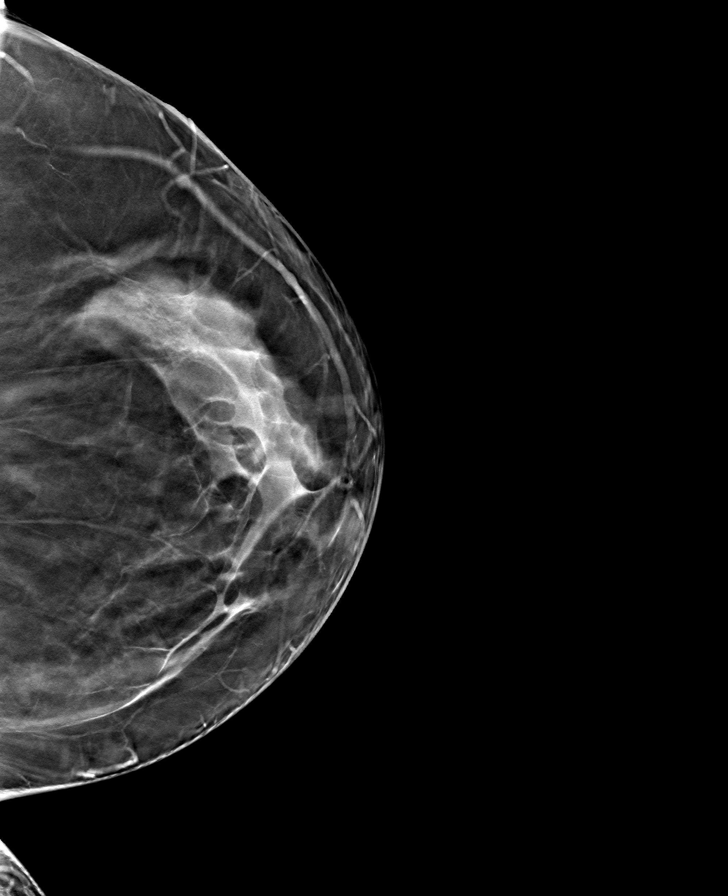

[L MLO tomo · tomo slice 37/73.0]
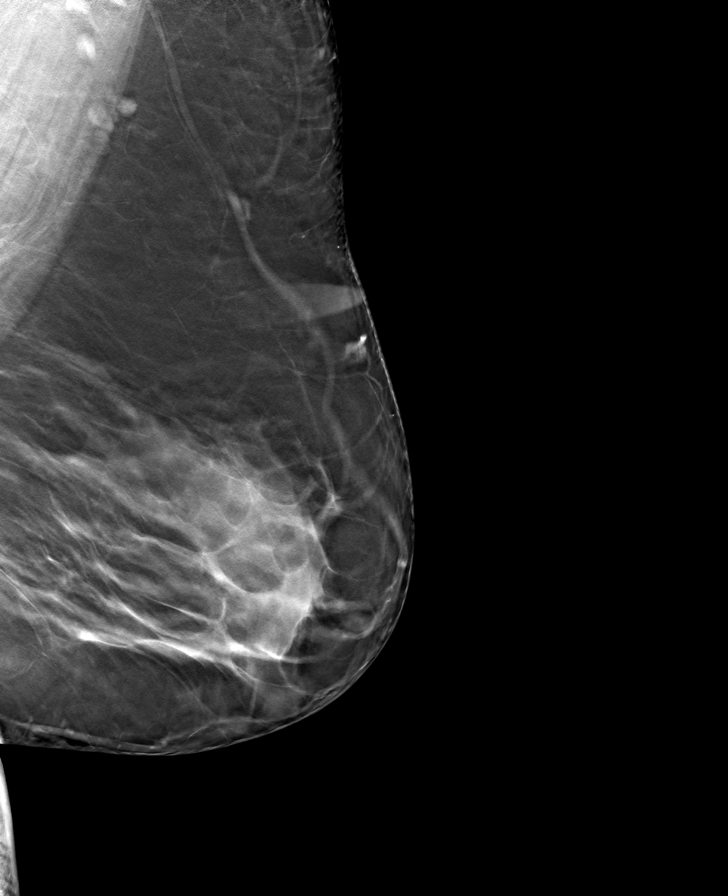

[R MLO tomo · tomo slice 38/75.0]
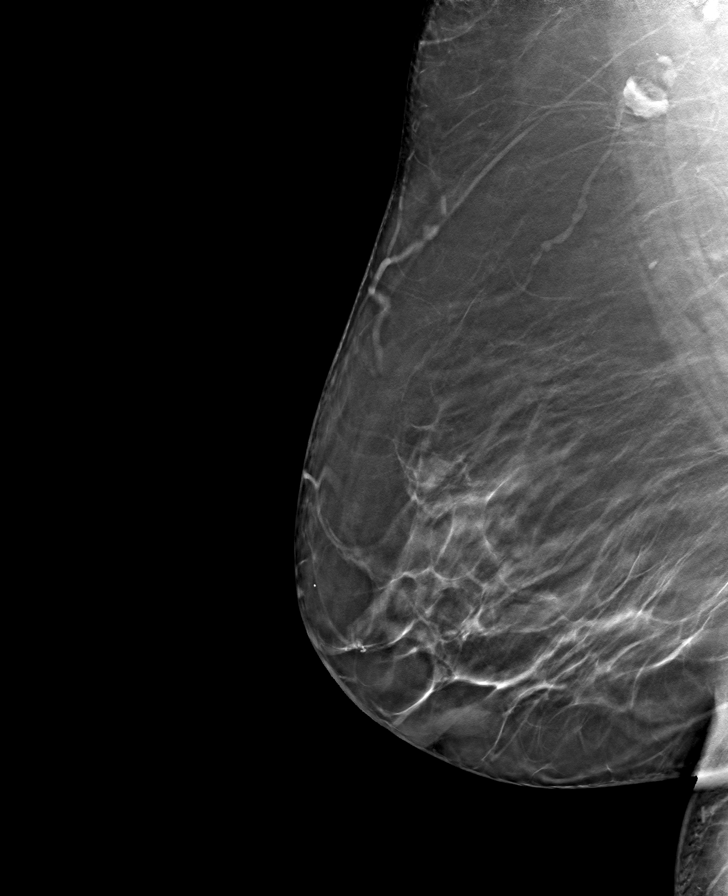

[R CC tomo · tomo slice 29/58.0]
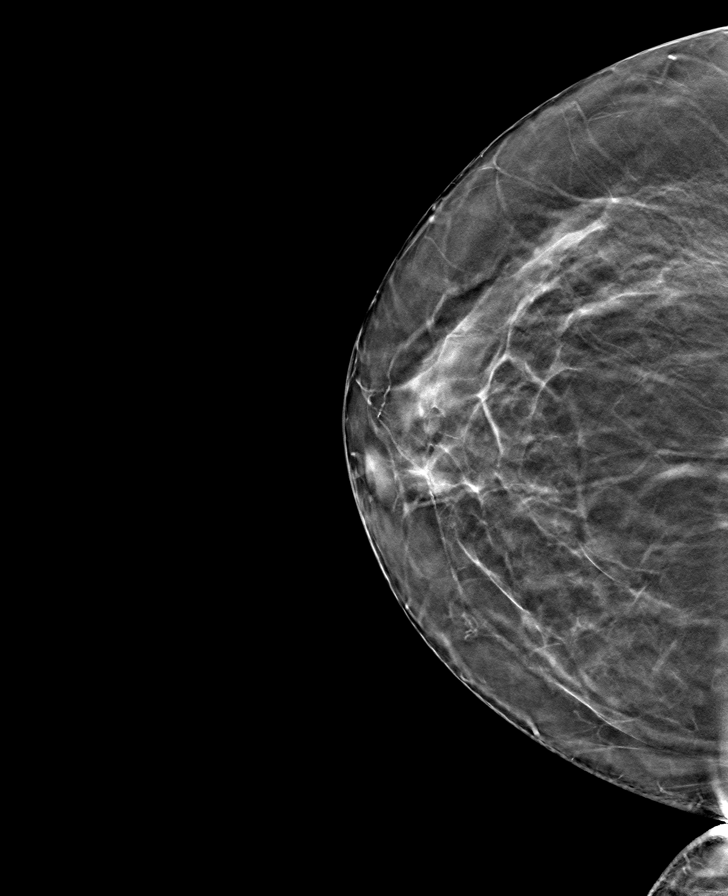

[8 of 24 positions shown; findings below may reference images not displayed]

ACR Breast Density Category c: The breast tissue is heterogeneously
dense, which may obscure small masses.
FINDINGS: There are no findings suspicious for malignancy.
IMPRESSION: No mammographic evidence of malignancy. A result letter of this
screening mammogram will be mailed directly to the patient.

RECOMMENDATION:
Screening mammogram in one year. (Code:[id number])

BI-RADS CATEGORY  1: Negative.

## 2023-09-14 ENCOUNTER — Other Ambulatory Visit: Payer: Self-pay | Admitting: Internal Medicine

## 2023-09-14 DIAGNOSIS — Z021 Encounter for pre-employment examination: Secondary | ICD-10-CM

## 2023-09-14 DIAGNOSIS — J984 Other disorders of lung: Secondary | ICD-10-CM

## 2024-01-24 ENCOUNTER — Other Ambulatory Visit: Payer: Self-pay | Admitting: Internal Medicine

## 2024-01-24 DIAGNOSIS — Z1231 Encounter for screening mammogram for malignant neoplasm of breast: Secondary | ICD-10-CM

## 2024-02-07 ENCOUNTER — Ambulatory Visit
Admission: RE | Admit: 2024-02-07 | Discharge: 2024-02-07 | Disposition: A | Discharge status: Home / Self Care | Source: Ambulatory Visit | Attending: Internal Medicine | Admitting: Internal Medicine

## 2024-02-07 DIAGNOSIS — Z1231 Encounter for screening mammogram for malignant neoplasm of breast: Secondary | ICD-10-CM | POA: Insufficient documentation

## 2024-03-08 DIAGNOSIS — Z6841 Body Mass Index (BMI) 40.0 and over, adult: Secondary | ICD-10-CM | POA: Diagnosis not present

## 2024-03-08 DIAGNOSIS — F324 Major depressive disorder, single episode, in partial remission: Secondary | ICD-10-CM | POA: Diagnosis not present

## 2024-03-08 DIAGNOSIS — E1169 Type 2 diabetes mellitus with other specified complication: Secondary | ICD-10-CM | POA: Diagnosis not present

## 2024-03-08 DIAGNOSIS — I10 Essential (primary) hypertension: Secondary | ICD-10-CM | POA: Diagnosis not present

## 2024-03-08 DIAGNOSIS — F17220 Nicotine dependence, chewing tobacco, uncomplicated: Secondary | ICD-10-CM | POA: Diagnosis not present

## 2024-03-08 DIAGNOSIS — E785 Hyperlipidemia, unspecified: Secondary | ICD-10-CM | POA: Diagnosis not present

## 2024-03-08 DIAGNOSIS — F17211 Nicotine dependence, cigarettes, in remission: Secondary | ICD-10-CM | POA: Diagnosis not present

## 2024-03-08 DIAGNOSIS — Z008 Encounter for other general examination: Secondary | ICD-10-CM | POA: Diagnosis not present

## 2024-04-19 ENCOUNTER — Encounter: Payer: Self-pay | Admitting: Ophthalmology

## 2024-04-24 NOTE — Discharge Instructions (Signed)

## 2024-04-27 ENCOUNTER — Other Ambulatory Visit: Payer: Self-pay

## 2024-04-27 ENCOUNTER — Ambulatory Visit: Payer: Self-pay | Admitting: Anesthesiology

## 2024-04-27 ENCOUNTER — Encounter: Payer: Self-pay | Admitting: Ophthalmology

## 2024-04-27 ENCOUNTER — Ambulatory Visit
Admission: RE | Admit: 2024-04-27 | Discharge: 2024-04-27 | Disposition: A | Discharge status: Home / Self Care | Attending: Ophthalmology | Admitting: Ophthalmology

## 2024-04-27 ENCOUNTER — Encounter
Admission: RE | Disposition: A | Discharge status: Home / Self Care | Payer: Self-pay | Source: Home / Self Care | Attending: Ophthalmology

## 2024-04-27 DIAGNOSIS — H2181 Floppy iris syndrome: Secondary | ICD-10-CM | POA: Diagnosis not present

## 2024-04-27 DIAGNOSIS — Z7984 Long term (current) use of oral hypoglycemic drugs: Secondary | ICD-10-CM | POA: Diagnosis not present

## 2024-04-27 DIAGNOSIS — I10 Essential (primary) hypertension: Secondary | ICD-10-CM | POA: Diagnosis not present

## 2024-04-27 DIAGNOSIS — H2512 Age-related nuclear cataract, left eye: Secondary | ICD-10-CM | POA: Insufficient documentation

## 2024-04-27 DIAGNOSIS — Z87891 Personal history of nicotine dependence: Secondary | ICD-10-CM | POA: Diagnosis not present

## 2024-04-27 DIAGNOSIS — E1136 Type 2 diabetes mellitus with diabetic cataract: Secondary | ICD-10-CM | POA: Insufficient documentation

## 2024-04-27 HISTORY — DX: Essential (primary) hypertension: I10

## 2024-04-27 HISTORY — PX: CATARACT EXTRACTION W/PHACO: SHX586

## 2024-04-27 SURGERY — PHACOEMULSIFICATION, CATARACT, WITH IOL INSERTION
Anesthesia: Monitor Anesthesia Care | Laterality: Left

## 2024-04-27 MED ORDER — ARMC OPHTHALMIC DILATING DROPS
1.0000 | OPHTHALMIC | Status: DC | PRN
  Administered 2024-04-27 (×3): 1 via OPHTHALMIC

## 2024-04-27 MED ORDER — LIDOCAINE HCL (PF) 2 % IJ SOLN
INTRAOCULAR | Status: DC | PRN
  Administered 2024-04-27: 1 mL via INTRAOCULAR

## 2024-04-27 MED ORDER — FENTANYL CITRATE (PF) 100 MCG/2ML IJ SOLN
INTRAMUSCULAR | Status: DC | PRN
  Administered 2024-04-27 (×2): 50 ug via INTRAVENOUS

## 2024-04-27 MED ORDER — LACTATED RINGERS IV SOLN
INTRAVENOUS | Status: DC
Start: 2024-04-27 — End: 2024-04-27

## 2024-04-27 MED ORDER — TETRACAINE HCL 0.5 % OP SOLN
OPHTHALMIC | Status: AC
  Filled 2024-04-27: qty 4

## 2024-04-27 MED ORDER — MOXIFLOXACIN HCL 0.5 % OP SOLN
OPHTHALMIC | Status: DC | PRN
Start: 2024-04-27 — End: 2024-04-27
  Administered 2024-04-27: .2 mL via OPHTHALMIC

## 2024-04-27 MED ORDER — MIDAZOLAM HCL 2 MG/2ML IJ SOLN
INTRAMUSCULAR | Status: DC | PRN
  Administered 2024-04-27 (×2): 1 mg via INTRAVENOUS

## 2024-04-27 MED ORDER — TETRACAINE HCL 0.5 % OP SOLN
1.0000 [drp] | OPHTHALMIC | Status: DC | PRN
Start: 2024-04-27 — End: 2024-04-27
  Administered 2024-04-27 (×3): 1 [drp] via OPHTHALMIC

## 2024-04-27 MED ORDER — SIGHTPATH DOSE#1 NA HYALUR & NA CHOND-NA HYALUR IO KIT
PACK | INTRAOCULAR | Status: DC | PRN
  Administered 2024-04-27: 1 via OPHTHALMIC

## 2024-04-27 MED ORDER — FENTANYL CITRATE (PF) 100 MCG/2ML IJ SOLN
INTRAMUSCULAR | Status: AC
  Filled 2024-04-27: qty 2

## 2024-04-27 MED ORDER — MIDAZOLAM HCL 2 MG/2ML IJ SOLN
INTRAMUSCULAR | Status: AC
Start: 2024-04-27 — End: 2024-04-27
  Filled 2024-04-27: qty 2

## 2024-04-27 MED ORDER — SIGHTPATH DOSE#1 BSS IO SOLN
INTRAOCULAR | Status: DC | PRN
  Administered 2024-04-27: 98 mL via OPHTHALMIC

## 2024-04-27 MED ORDER — SIGHTPATH DOSE#1 BSS IO SOLN
INTRAOCULAR | Status: DC | PRN
  Administered 2024-04-27: 15 mL via INTRAOCULAR

## 2024-04-27 MED ORDER — ARMC OPHTHALMIC DILATING DROPS
OPHTHALMIC | Status: AC
Start: 2024-04-27 — End: 2024-04-27
  Filled 2024-04-27: qty 0.5

## 2024-04-27 SURGICAL SUPPLY — 13 items
CATARACT SUITE SIGHTPATH (MISCELLANEOUS) ×1 IMPLANT
DISSECTOR HYDRO NUCLEUS 50X22 (MISCELLANEOUS) ×1 IMPLANT
FEE CATARACT SUITE SIGHTPATH (MISCELLANEOUS) ×1 IMPLANT
GLOVE PI ULTRA LF STRL 7.5 (GLOVE) ×1 IMPLANT
GLOVE SURG POLYISOPRENE 8.5 (GLOVE) ×1 IMPLANT
GLOVE SURG PROTEXIS BL SZ6.5 (GLOVE) ×1 IMPLANT
GLOVE SURG SYN 6.5 PF PI BL (GLOVE) ×1 IMPLANT
GLOVE SURG SYN 8.5 PF PI BL (GLOVE) ×1 IMPLANT
LENS IOL TECNIS EYHANCE 21.0 (Intraocular Lens) IMPLANT
NDL FILTER BLUNT 18X1 1/2 (NEEDLE) ×1 IMPLANT
NEEDLE FILTER BLUNT 18X1 1/2 (NEEDLE) ×1 IMPLANT
RING MALYGIN (MISCELLANEOUS) IMPLANT
SYR 3ML LL SCALE MARK (SYRINGE) ×1 IMPLANT

## 2024-04-27 NOTE — Anesthesia Preprocedure Evaluation (Signed)
 Anesthesia Evaluation  Patient identified by MRN, date of birth, ID band Patient awake    Reviewed: Allergy & Precautions, NPO status , Patient's Chart, lab work & pertinent test results  History of Anesthesia Complications Negative for: history of anesthetic complications  Airway Mallampati: III  TM Distance: <3 FB Neck ROM: full    Dental  (+) Chipped   Pulmonary neg pulmonary ROS, neg shortness of breath, former smoker   Pulmonary exam normal        Cardiovascular hypertension, Normal cardiovascular exam     Neuro/Psych negative neurological ROS  negative psych ROS   GI/Hepatic negative GI ROS, Neg liver ROS,neg GERD  ,,  Endo/Other  diabetes    Renal/GU      Musculoskeletal   Abdominal   Peds  Hematology negative hematology ROS (+)   Anesthesia Other Findings Past Medical History: No date: Diabetes mellitus without complication (HCC) No date: Hypertension  Past Surgical History: No date: ABDOMINAL HYSTERECTOMY No date: BREAST EXCISIONAL BIOPSY No date: LYMPHADENECTOMY; Left     Comment:  Around 1995 Neg No date: TUBAL LIGATION  BMI    Body Mass Index: 43.92 kg/m      Reproductive/Obstetrics negative OB ROS                             Anesthesia Physical Anesthesia Plan  ASA: 3  Anesthesia Plan: MAC   Post-op Pain Management:    Induction: Intravenous  PONV Risk Score and Plan:   Airway Management Planned: Natural Airway and Nasal Cannula  Additional Equipment:   Intra-op Plan:   Post-operative Plan:   Informed Consent: I have reviewed the patients History and Physical, chart, labs and discussed the procedure including the risks, benefits and alternatives for the proposed anesthesia with the patient or authorized representative who has indicated his/her understanding and acceptance.     Dental Advisory Given  Plan Discussed with: Anesthesiologist, CRNA  and Surgeon  Anesthesia Plan Comments: (Patient consented for risks of anesthesia including but not limited to:  - adverse reactions to medications - damage to eyes, teeth, lips or other oral mucosa - nerve damage due to positioning  - sore throat or hoarseness - Damage to heart, brain, nerves, lungs, other parts of body or loss of life  Patient voiced understanding and assent.)       Anesthesia Quick Evaluation

## 2024-04-27 NOTE — Transfer of Care (Signed)
 Immediate Anesthesia Transfer of Care Note  Patient: Madison Simmons  Procedure(s) Performed: PHACOEMULSIFICATION, CATARACT, WITH IOL INSERTION 4.82, 00:46.3 (Left)  Patient Location: PACU  Anesthesia Type: MAC  Level of Consciousness: awake, alert  and patient cooperative  Airway and Oxygen Therapy: Patient Spontanous Breathing and Patient connected to supplemental oxygen  Post-op Assessment: Post-op Vital signs reviewed, Patient's Cardiovascular Status Stable, Respiratory Function Stable, Patent Airway and No signs of Nausea or vomiting  Post-op Vital Signs: Reviewed and stable  Complications: No notable events documented.

## 2024-04-27 NOTE — Op Note (Signed)
 OPERATIVE NOTE  Madison Simmons [id number] 04/27/2024   PREOPERATIVE DIAGNOSIS:  Nuclear sclerotic cataract left eye.  H25.12   POSTOPERATIVE DIAGNOSIS:      Nuclear sclerotic cataract left eye.   Intraoperative floppy iris syndrome   PROCEDURE:  CPT [phone] Complex Phacoemusification with posterior chamber intraocular lens placement of the left eye requiring insertion of malyugin ring for dilation and stabilization of the iris.  LENS:   Implant Name Type Inv. Item Serial No. Manufacturer Lot No. LRB No. Used Action  LENS IOL TECNIS EYHANCE 21.0 - [id number] Intraocular Lens LENS IOL TECNIS EYHANCE 21.0 [id number] [employer]  Left 1 Implanted      Procedure(s): PHACOEMULSIFICATION, CATARACT, WITH IOL INSERTION 4.82, 00:46.3 (Left)  SURGEON:  Adine Novak, MD, MPH   ANESTHESIA:  Topical with tetracaine drops augmented with 1% preservative-free intracameral lidocaine.  ESTIMATED BLOOD LOSS: <1 mL   COMPLICATIONS:  None.   DESCRIPTION OF PROCEDURE:  The patient was identified in the holding room and transported to the operating room and placed in the supine position under the operating microscope.  The left eye was identified as the operative eye and it was prepped and draped in the usual sterile ophthalmic fashion.   A 1.0 millimeter clear-corneal paracentesis was made at the 5:00 position. 0.5 ml of preservative-free 1% lidocaine with epinephrine was injected into the anterior chamber.  The anterior chamber was filled with viscoelastic.  A 2.4 millimeter keratome was used to make a near-clear corneal incision at the 2:00 position.   A malyugin ring was inserted. A curvilinear capsulorrhexis was made with a cystotome and capsulorrhexis forceps.  Balanced salt solution was used to hydrodissect and hydrodelineate the nucleus.   Phacoemulsification was then used in stop and chop fashion to remove the lens nucleus and epinucleus.  The remaining cortex was then removed using the irrigation  and aspiration handpiece. Viscoelastic was then placed into the capsular bag to distend it for lens placement.  A lens was then injected into the capsular bag.    The malyugin ring was removed without difficulty. The remaining viscoelastic was aspirated.   Wounds were hydrated with balanced salt solution.  The anterior chamber was inflated to a physiologic pressure with balanced salt solution.   Intracameral vigamox 0.1 mL undiltued was injected into the eye and a drop placed onto the ocular surface.  No wound leaks were noted.  The patient was taken to the recovery room in stable condition without complications of anesthesia or surgery  Adine Novak 04/27/2024, 8:52 AM

## 2024-04-27 NOTE — H&P (Signed)
 [facility]   Primary Care Physician:  Sadie Manna, MD Ophthalmologist: Dr. Adine Novak  Pre-Procedure History & Physical: HPI:  Shameria Trimarco is a 63 y.o. female here for cataract surgery.   Past Medical History:  Diagnosis Date   Diabetes mellitus without complication (HCC)    Hypertension     Past Surgical History:  Procedure Laterality Date   ABDOMINAL HYSTERECTOMY     BREAST EXCISIONAL BIOPSY     LYMPHADENECTOMY Left    Around 1995 Neg   TUBAL LIGATION      Prior to Admission medications   Medication Sig Start Date End Date Taking? Authorizing Provider  amLODipine (NORVASC) 10 MG tablet Take 10 mg by mouth daily.   Yes [provider]  atorvastatin (LIPITOR) 20 MG tablet Take 20 mg by mouth daily.   Yes [provider]  citalopram (CELEXA) 40 MG tablet Take 40 mg by mouth daily.   Yes [provider]  metFORMIN (GLUCOPHAGE) 500 MG tablet Take 500 mg by mouth 2 (two) times daily with a meal.   Yes [provider]  Multiple Vitamin (MULTIVITAMIN ADULT PO) Take by mouth.   Yes [provider]  Semaglutide 3 MG TABS Take by mouth.   Yes [provider]    Allergies as of 04/06/2024   (No Known Allergies)    Family History  Problem Relation Age of Onset   Breast cancer Mother        41's   Breast cancer Maternal Aunt        64's    Social History   Socioeconomic History   Marital status: Single    Spouse name: Not on file   Number of children: Not on file   Years of education: Not on file   Highest education level: Not on file  Occupational History   Not on file  Tobacco Use   Smoking status: Former    Types: Cigarettes   Smokeless tobacco: Current    Types: Snuff  Substance and Sexual Activity   Alcohol use: Yes    Comment: occ   Drug use: Not Currently   Sexual activity: Not on file  Other Topics Concern   Not on file  Social History Narrative   Not on file   Social Drivers of  Health   Financial Resource Strain: Low Risk  (01/24/2024)   Received from [facility]   Overall Financial Resource Strain (CARDIA)    Difficulty of Paying Living Expenses: Not hard at all  Food Insecurity: No Food Insecurity (01/24/2024)   Received from [facility]   Hunger Vital Sign    Within the past 12 months, you worried that your food would run out before you got the money to buy more.: Never true    Within the past 12 months, the food you bought just didn't last and you didn't have money to get more.: Never true  Transportation Needs: No Transportation Needs (01/24/2024)   Received from [facility] - Transportation    In the past 12 months, has lack of transportation kept you from medical appointments or from getting medications?: No    Lack of Transportation (Non-Medical): No  Physical Activity: Unknown (01/18/2018)   Received from [facility]   Exercise Vital Sign    Days of Exercise per Week: Patient declined    Minutes of Exercise per Session: Patient declined  Stress: Stress Concern Present (01/18/2018)  Received from [facility]   [employer] [facility] - Occupational Stress Questionnaire    Feeling of Stress : To some extent  Social Connections: Unknown (01/18/2018)   Received from [facility]   Social Connection and Isolation Panel    Frequency of Communication with Friends and Family: Patient declined    Frequency of Social Gatherings with Friends and Family: Patient declined    Attends Religious Services: Patient declined    Database administrator or Organizations: Patient declined    Attends Engineer, structural: Patient declined    Marital Status: Patient declined  Catering manager Violence: Not on file    Review of Systems: See HPI, otherwise negative ROS  Physical Exam: BP (!) 141/74   Pulse 73   Temp 98.2 F (36.8  C) (Temporal)   Resp 20   Ht 5' 5.5 (1.664 m)   Wt 121.6 kg   SpO2 98%   BMI 43.92 kg/m  General:   Alert, cooperative. Head:  Normocephalic and atraumatic. Respiratory:  Normal work of breathing. Cardiovascular:  NAD  Impression/Plan: Janay Canan is here for cataract surgery.  Risks, benefits, limitations, and alternatives regarding cataract surgery have been reviewed with the patient.  Questions have been answered.  All parties agreeable.   Adine Novak, MD  04/27/2024, 8:19 AM

## 2024-04-27 NOTE — Anesthesia Postprocedure Evaluation (Signed)
 Anesthesia Post Note  Patient: Madison Simmons  Procedure(s) Performed: PHACOEMULSIFICATION, CATARACT, WITH IOL INSERTION 4.82, 00:46.3 (Left)  Patient location during evaluation: PACU Anesthesia Type: MAC Level of consciousness: awake and alert Pain management: pain level controlled Vital Signs Assessment: post-procedure vital signs reviewed and stable Respiratory status: spontaneous breathing, nonlabored ventilation and respiratory function stable Cardiovascular status: blood pressure returned to baseline and stable Postop Assessment: no apparent nausea or vomiting Anesthetic complications: no   No notable events documented.   Last Vitals:  Vitals:   04/27/24 0859 04/27/24 0900  BP: (!) 132/94 (!) 132/94  Pulse: 76 69  Resp: 14 16  Temp: (!) 36.4 C   SpO2: 98% 98%    Last Pain:  Vitals:   04/27/24 0859  TempSrc:   PainSc: 0-No pain                 Fairy POUR Ferry Matthis

## 2024-04-30 ENCOUNTER — Encounter: Payer: Self-pay | Admitting: Ophthalmology

## 2024-05-08 ENCOUNTER — Encounter: Payer: Self-pay | Admitting: Ophthalmology

## 2024-05-11 NOTE — Discharge Instructions (Signed)

## 2024-05-15 NOTE — Anesthesia Preprocedure Evaluation (Addendum)
 Anesthesia Evaluation  Patient identified by MRN, date of birth, ID band Patient awake    Reviewed: Allergy & Precautions, H&P , NPO status , Patient's Chart, lab work & pertinent test results  Airway Mallampati: III  TM Distance: <3 FB Neck ROM: Full    Dental no notable dental hx.    Pulmonary former smoker   Pulmonary exam normal breath sounds clear to auscultation       Cardiovascular hypertension, Normal cardiovascular exam Rhythm:Regular Rate:Normal     Neuro/Psych negative neurological ROS  negative psych ROS   GI/Hepatic negative GI ROS, Neg liver ROS,,,  Endo/Other  diabetes    Renal/GU negative Renal ROS  negative genitourinary   Musculoskeletal negative musculoskeletal ROS (+)    Abdominal   Peds negative pediatric ROS (+)  Hematology negative hematology ROS (+)   Anesthesia Other Findings Previous cataract surgery 04-27-24 dr. Stevan  Diabetes mellitus HTN  Reproductive/Obstetrics negative OB ROS                              Anesthesia Physical Anesthesia Plan  ASA: 3  Anesthesia Plan: MAC   Post-op Pain Management:    Induction: Intravenous  PONV Risk Score and Plan:   Airway Management Planned: Natural Airway and Nasal Cannula  Additional Equipment:   Intra-op Plan:   Post-operative Plan:   Informed Consent: I have reviewed the patients History and Physical, chart, labs and discussed the procedure including the risks, benefits and alternatives for the proposed anesthesia with the patient or authorized representative who has indicated his/her understanding and acceptance.     Dental Advisory Given  Plan Discussed with: Anesthesiologist, CRNA and Surgeon  Anesthesia Plan Comments: (Patient consented for risks of anesthesia including but not limited to:  - adverse reactions to medications - damage to eyes, teeth, lips or other oral mucosa - nerve  damage due to positioning  - sore throat or hoarseness - Damage to heart, brain, nerves, lungs, other parts of body or loss of life  Patient voiced understanding and assent.)         Anesthesia Quick Evaluation

## 2024-05-21 ENCOUNTER — Ambulatory Visit: Payer: Self-pay | Admitting: Anesthesiology

## 2024-05-21 ENCOUNTER — Other Ambulatory Visit: Payer: Self-pay

## 2024-05-21 ENCOUNTER — Ambulatory Visit
Admission: RE | Admit: 2024-05-21 | Discharge: 2024-05-21 | Disposition: A | Discharge status: Home / Self Care | Source: Ambulatory Visit | Attending: Ophthalmology | Admitting: Ophthalmology

## 2024-05-21 ENCOUNTER — Encounter
Admission: RE | Disposition: A | Discharge status: Home / Self Care | Payer: Self-pay | Source: Ambulatory Visit | Attending: Ophthalmology

## 2024-05-21 DIAGNOSIS — Z9842 Cataract extraction status, left eye: Secondary | ICD-10-CM | POA: Insufficient documentation

## 2024-05-21 DIAGNOSIS — F17220 Nicotine dependence, chewing tobacco, uncomplicated: Secondary | ICD-10-CM | POA: Diagnosis not present

## 2024-05-21 DIAGNOSIS — E1136 Type 2 diabetes mellitus with diabetic cataract: Secondary | ICD-10-CM | POA: Insufficient documentation

## 2024-05-21 DIAGNOSIS — Z7984 Long term (current) use of oral hypoglycemic drugs: Secondary | ICD-10-CM | POA: Diagnosis not present

## 2024-05-21 DIAGNOSIS — I10 Essential (primary) hypertension: Secondary | ICD-10-CM | POA: Insufficient documentation

## 2024-05-21 DIAGNOSIS — Z961 Presence of intraocular lens: Secondary | ICD-10-CM | POA: Diagnosis not present

## 2024-05-21 DIAGNOSIS — H2511 Age-related nuclear cataract, right eye: Secondary | ICD-10-CM | POA: Insufficient documentation

## 2024-05-21 HISTORY — PX: CATARACT EXTRACTION W/PHACO: SHX586

## 2024-05-21 LAB — GLUCOSE, CAPILLARY: Glucose-Capillary: 144 mg/dL — ABNORMAL HIGH (ref 70–99)

## 2024-05-21 SURGERY — PHACOEMULSIFICATION, CATARACT, WITH IOL INSERTION
Anesthesia: Monitor Anesthesia Care | Site: Eye | Laterality: Right

## 2024-05-21 MED ORDER — SIGHTPATH DOSE#1 BSS IO SOLN
INTRAOCULAR | Status: DC | PRN
  Administered 2024-05-21: 79 mL via OPHTHALMIC

## 2024-05-21 MED ORDER — SODIUM CHLORIDE 0.9% FLUSH
INTRAVENOUS | Status: DC | PRN
  Administered 2024-05-21: 10 mL via INTRAVENOUS

## 2024-05-21 MED ORDER — FENTANYL CITRATE (PF) 100 MCG/2ML IJ SOLN
INTRAMUSCULAR | Status: AC
  Filled 2024-05-21: qty 2

## 2024-05-21 MED ORDER — FENTANYL CITRATE (PF) 100 MCG/2ML IJ SOLN
INTRAMUSCULAR | Status: DC | PRN
  Administered 2024-05-21: 25 ug via INTRAVENOUS

## 2024-05-21 MED ORDER — TETRACAINE HCL 0.5 % OP SOLN
OPHTHALMIC | Status: AC
  Filled 2024-05-21: qty 4

## 2024-05-21 MED ORDER — ACETAMINOPHEN 325 MG PO TABS
650.0000 mg | ORAL_TABLET | Freq: Once | ORAL | Status: AC
  Administered 2024-05-21: 650 mg via ORAL

## 2024-05-21 MED ORDER — ACETAMINOPHEN 325 MG PO TABS
ORAL_TABLET | ORAL | Status: AC
Start: 2024-05-21 — End: 2024-05-21
  Filled 2024-05-21: qty 2

## 2024-05-21 MED ORDER — ARMC OPHTHALMIC DILATING DROPS
1.0000 | OPHTHALMIC | Status: DC | PRN
  Administered 2024-05-21 (×3): 1 via OPHTHALMIC

## 2024-05-21 MED ORDER — MOXIFLOXACIN HCL 0.5 % OP SOLN
OPHTHALMIC | Status: DC | PRN
  Administered 2024-05-21: .2 mL via OPHTHALMIC

## 2024-05-21 MED ORDER — MIDAZOLAM HCL 2 MG/2ML IJ SOLN
INTRAMUSCULAR | Status: AC
  Filled 2024-05-21: qty 2

## 2024-05-21 MED ORDER — SIGHTPATH DOSE#1 NA HYALUR & NA CHOND-NA HYALUR IO KIT
PACK | INTRAOCULAR | Status: DC | PRN
Start: 2024-05-21 — End: 2024-05-21
  Administered 2024-05-21: 1 via OPHTHALMIC

## 2024-05-21 MED ORDER — ARMC OPHTHALMIC DILATING DROPS
OPHTHALMIC | Status: AC
  Filled 2024-05-21: qty 0.5

## 2024-05-21 MED ORDER — LACTATED RINGERS IV SOLN: INTRAVENOUS | Status: DC

## 2024-05-21 MED ORDER — TETRACAINE HCL 0.5 % OP SOLN
1.0000 [drp] | OPHTHALMIC | Status: DC | PRN
  Administered 2024-05-21 (×3): 1 [drp] via OPHTHALMIC

## 2024-05-21 MED ORDER — MIDAZOLAM HCL 2 MG/2ML IJ SOLN
INTRAMUSCULAR | Status: DC | PRN
  Administered 2024-05-21: 1 mg via INTRAVENOUS

## 2024-05-21 MED ORDER — SIGHTPATH DOSE#1 BSS IO SOLN
INTRAOCULAR | Status: DC | PRN
  Administered 2024-05-21: 15 mL via INTRAOCULAR

## 2024-05-21 MED ORDER — LIDOCAINE HCL (PF) 2 % IJ SOLN
INTRAMUSCULAR | Status: DC | PRN
  Administered 2024-05-21: 4 mL via INTRAOCULAR

## 2024-05-21 SURGICAL SUPPLY — 10 items
CATARACT SUITE SIGHTPATH (MISCELLANEOUS) ×1 IMPLANT
DISSECTOR HYDRO NUCLEUS 50X22 (MISCELLANEOUS) ×1 IMPLANT
FEE CATARACT SUITE SIGHTPATH (MISCELLANEOUS) ×1 IMPLANT
GLOVE PI ULTRA LF STRL 7.5 (GLOVE) ×1 IMPLANT
GLOVE SURG SYN 6.5 PF PI BL (GLOVE) ×1 IMPLANT
GLOVE SURG SYN 8.5 PF PI BL (GLOVE) ×1 IMPLANT
LENS IOL TECNIS EYHANCE 21.5 (Intraocular Lens) IMPLANT
NDL FILTER BLUNT 18X1 1/2 (NEEDLE) ×1 IMPLANT
NEEDLE FILTER BLUNT 18X1 1/2 (NEEDLE) ×1 IMPLANT
SYR 3ML LL SCALE MARK (SYRINGE) ×1 IMPLANT

## 2024-05-21 NOTE — Anesthesia Postprocedure Evaluation (Signed)
 Anesthesia Post Note  Patient: Madison Simmons  Procedure(s) Performed: PHACOEMULSIFICATION, CATARACT, WITH IOL INSERTION 4.06 00:36.0 (Right: Eye)  Patient location during evaluation: PACU Anesthesia Type: MAC Level of consciousness: awake and alert Pain management: pain level controlled Vital Signs Assessment: post-procedure vital signs reviewed and stable Respiratory status: spontaneous breathing, nonlabored ventilation, respiratory function stable and patient connected to nasal cannula oxygen Cardiovascular status: stable and blood pressure returned to baseline Postop Assessment: no apparent nausea or vomiting Anesthetic complications: no   No notable events documented.   Last Vitals:  Vitals:   05/21/24 0902 05/21/24 0910  BP: (!) 151/87 (!) 155/79  Pulse: 70 69  Resp: 16 18  Temp: 36.6 C 36.6 C  SpO2: 98% 100%    Last Pain:  Vitals:   05/21/24 0910  TempSrc:   PainSc: 6                  Jacqueline Spofford C Casimira Sutphin

## 2024-05-21 NOTE — H&P (Signed)
 [facility]   Primary Care Physician:  Sadie Manna, MD Ophthalmologist: Dr. Adine Novak  Pre-Procedure History & Physical: HPI:  Ader Fritze is a 63 y.o. female here for cataract surgery.   Past Medical History:  Diagnosis Date   Diabetes mellitus without complication (HCC)    Hypertension     Past Surgical History:  Procedure Laterality Date   ABDOMINAL HYSTERECTOMY     BREAST EXCISIONAL BIOPSY     CATARACT EXTRACTION W/PHACO Left 04/27/2024   Procedure: PHACOEMULSIFICATION, CATARACT, WITH IOL INSERTION 4.82, 00:46.3;  Surgeon: Novak Adine Anes, MD;  Location: [facility];  Service: Ophthalmology;  Laterality: Left;   LYMPHADENECTOMY Left    Around 1995 Neg   TUBAL LIGATION      Prior to Admission medications   Medication Sig Start Date End Date Taking? Authorizing Provider  amLODipine (NORVASC) 10 MG tablet Take 10 mg by mouth daily.   Yes [provider]  atorvastatin (LIPITOR) 20 MG tablet Take 20 mg by mouth daily.   Yes [provider]  citalopram (CELEXA) 40 MG tablet Take 40 mg by mouth daily.   Yes [provider]  metFORMIN (GLUCOPHAGE) 500 MG tablet Take 500 mg by mouth 2 (two) times daily with a meal.   Yes [provider]  Multiple Vitamin (MULTIVITAMIN ADULT PO) Take by mouth.   Yes [provider]  Semaglutide 3 MG TABS Take by mouth.   Yes [provider]    Allergies as of 04/06/2024   (No Known Allergies)    Family History  Problem Relation Age of Onset   Breast cancer Mother        10's   Breast cancer Maternal Aunt        49's    Social History   Socioeconomic History   Marital status: Single    Spouse name: Not on file   Number of children: Not on file   Years of education: Not on file   Highest education level: Not on file  Occupational History   Not on file  Tobacco Use   Smoking status: Former    Types: Cigarettes   Smokeless tobacco: Current    Types:  Snuff  Substance and Sexual Activity   Alcohol use: Yes    Comment: occ   Drug use: Not Currently   Sexual activity: Not on file  Other Topics Concern   Not on file  Social History Narrative   Not on file   Social Drivers of Health   Financial Resource Strain: Low Risk  (01/24/2024)   Received from [facility]   Overall Financial Resource Strain (CARDIA)    Difficulty of Paying Living Expenses: Not hard at all  Food Insecurity: No Food Insecurity (01/24/2024)   Received from [facility]   Hunger Vital Sign    Within the past 12 months, you worried that your food would run out before you got the money to buy more.: Never true    Within the past 12 months, the food you bought just didn't last and you didn't have money to get more.: Never true  Transportation Needs: No Transportation Needs (01/24/2024)   Received from [facility] - Transportation    In the past 12 months, has lack of transportation kept you from medical appointments or from getting medications?: No    Lack of Transportation (Non-Medical): No  Physical Activity: Unknown (01/18/2018)   Received from [facility]   Exercise Vital Sign    Days of Exercise per Week: Patient declined    Minutes of Exercise per Session: Patient declined  Stress: Stress Concern Present (01/18/2018)   Received from [facility] - Occupational Stress Questionnaire    Feeling of Stress : To some extent  Social Connections: Unknown (01/18/2018)   Received from [facility]   Social Connection and Isolation Panel    Frequency of Communication with Friends and Family: Patient declined    Frequency of Social Gatherings with Friends and Family: Patient declined    Attends Religious Services: Patient declined    Database administrator or Organizations: Patient declined    Attends Museum/gallery exhibitions officer: Patient declined    Marital Status: Patient declined  Catering manager Violence: Not on file    Review of Systems: See HPI, otherwise negative ROS  Physical Exam: BP (!) 143/101   Pulse 72   Temp (!) 97.4 F (36.3 C) (Temporal)   Resp 16   Ht 5' 5.5 (1.664 m)   Wt 121.1 kg   SpO2 99%   BMI 43.76 kg/m  General:   Alert, cooperative. Head:  Normocephalic and atraumatic. Respiratory:  Normal work of breathing. Cardiovascular:  NAD  Impression/Plan: Chonda Baney is here for cataract surgery.  Risks, benefits, limitations, and alternatives regarding cataract surgery have been reviewed with the patient.  Questions have been answered.  All parties agreeable.   Adine Novak, MD  05/21/2024, 8:36 AM

## 2024-05-21 NOTE — Op Note (Signed)
 OPERATIVE NOTE  Khelani Kops [id number] 05/21/2024   PREOPERATIVE DIAGNOSIS:  Nuclear sclerotic cataract right eye.  H25.11   POSTOPERATIVE DIAGNOSIS:    Nuclear sclerotic cataract right eye.     PROCEDURE:  Phacoemusification with posterior chamber intraocular lens placement of the right eye   LENS:   Implant Name Type Inv. Item Serial No. Manufacturer Lot No. LRB No. Used Action  LENS IOL TECNIS EYHANCE 21.5 - [id number] Intraocular Lens LENS IOL TECNIS EYHANCE 21.5 [id number] [employer]  Right 1 Implanted       Procedure(s): PHACOEMULSIFICATION, CATARACT, WITH IOL INSERTION 4.06 00:36.0 (Right)  SURGEON:  Adine Novak, MD, MPH  ANESTHESIOLOGIST: Anesthesiologist: Ola Donny BROCKS, MD CRNA: Jarvis Lew, CRNA   ANESTHESIA:  Topical with tetracaine  drops augmented with 1% preservative-free intracameral lidocaine .  ESTIMATED BLOOD LOSS: less than 1 mL.   COMPLICATIONS:  None.   DESCRIPTION OF PROCEDURE:  The patient was identified in the holding room and transported to the operating room and placed in the supine position under the operating microscope.  The right eye was identified as the operative eye and it was prepped and draped in the usual sterile ophthalmic fashion.   A 1.0 millimeter clear-corneal paracentesis was made at the 10:30 position. 0.5 ml of preservative-free 1% lidocaine  with epinephrine  was injected into the anterior chamber.  The anterior chamber was filled with viscoelastic.  A 2.4 millimeter keratome was used to make a near-clear corneal incision at the 8:00 position.  A curvilinear capsulorrhexis was made with a cystotome and capsulorrhexis forceps.  Balanced salt solution was used to hydrodissect and hydrodelineate the nucleus.   Phacoemulsification was then used in stop and chop fashion to remove the lens nucleus and epinucleus.  The remaining cortex was then removed using the irrigation and aspiration handpiece. Viscoelastic was then placed into the  capsular bag to distend it for lens placement.  A lens was then injected into the capsular bag.  The remaining viscoelastic was aspirated.   Wounds were hydrated with balanced salt solution.  The anterior chamber was inflated to a physiologic pressure with balanced salt solution.   Intracameral vigamox  0.1 mL undiluted was injected into the eye and a drop placed onto the ocular surface.  No wound leaks were noted.  The patient was taken to the recovery room in stable condition without complications of anesthesia or surgery  Adine Novak 05/21/2024, 9:01 AM

## 2024-05-21 NOTE — Transfer of Care (Signed)
 Immediate Anesthesia Transfer of Care Note  Patient: Madison Simmons  Procedure(s) Performed: PHACOEMULSIFICATION, CATARACT, WITH IOL INSERTION 4.06 00:36.0 (Right: Eye)  Patient Location: PACU  Anesthesia Type:MAC  Level of Consciousness: awake and alert   Airway & Oxygen Therapy: Patient Spontanous Breathing  Post-op Assessment: Report given to RN and Post -op Vital signs reviewed and stable  Post vital signs: Reviewed and stable  Last Vitals:  Vitals Value Taken Time  BP 155/79 05/21/24 09:10  Temp 36.6 C 05/21/24 09:10  Pulse 67 05/21/24 09:11  Resp 17 05/21/24 09:11  SpO2 99 % 05/21/24 09:11  Vitals shown include unfiled device data.  Last Pain:  Vitals:   05/21/24 0910  TempSrc:   PainSc: 6          Complications: No notable events documented.

## 2024-05-22 ENCOUNTER — Encounter: Payer: Self-pay | Admitting: Ophthalmology

## 2024-05-29 NOTE — Progress Notes (Signed)
 HPI  Madison Simmons is a 63 y.o. here for a Follow upand a Medicare Wellness visit  Hx of Type 2 DM, Obesity, Tobacco use disorder (dips snuff)  and HTN C/o Rt sided headache - worse in the mornings  Also has a rash on left elbow  Has lost weight - On Rybelssu 3 mg po qd  Mood has improved - On Celexa On Gabapentin for low back and leg pain  Denies chest pains or shortness of breath Dips snuff daily - 1 can lasts 3  Weeks) Alcohol socially - every other week end  Separated- Has 2 adult children. Pt lives with her son and grand kids Brother died of MI at age 70  Pt not working currently  Labs: Hgb; 11.3 , Sugar: 151  ,se Creat: 0.9  ,  A1c; 7.3 , Total Cholesterol: 135, Triglycerides; 88 . LDL : 60 TSH: 1.898  ROS Rest of 10 point review of systems is normal.  Outpatient Encounter Medications as of 05/29/2024  Medication Sig Dispense Refill  . atorvastatin (LIPITOR) 20 MG tablet Take 1 tablet (20 mg total) by mouth once daily 90 tablet 1  . ACCU-CHEK GUIDE GLUCOSE METER Misc 1 each as directed 1 each 0  . ACCU-CHEK GUIDE L1-L2 CTRL SOL Soln Use 1 each as directed 1 each 3  . ACCU-CHEK SOFT DEV LANCETS kit Use 1 each 2 (two) times daily Use as instructed. 200 each 3  . amLODIPine (NORVASC) 10 MG tablet Take 1 tablet (10 mg total) by mouth once daily 90 tablet 1  . bisoproloL-hydroCHLOROthiazide (ZIAC) 10-6.25 mg tablet TAKE 1 TABLET BY MOUTH EVERY DAY 30 tablet 2  . blood glucose diagnostic (ACCU-CHEK GUIDE TEST STRIPS) test strip 1 each (1 strip total) once daily 100 each 5  . blood glucose meter kit 2 (two) times daily 1 each 0  . cholecalciferol (VITAMIN D3) 1,000 unit capsule Take 2,000 Units by mouth once daily    . citalopram (CELEXA) 40 MG tablet TAKE 1 TABLET BY MOUTH EVERY DAY 30 tablet 5  . cyanocobalamin (VITAMIN B12) 1000 MCG tablet Take 2,000 mcg by mouth once daily    . gabapentin (NEURONTIN) 400 MG capsule Take 1 capsule (400 mg total) by mouth 2 (two) times daily  60 capsule 5  . meloxicam (MOBIC) 15 MG tablet TAKE 1 TABLET (15 MG TOTAL) BY MOUTH ONCE DAILY AS NEEDED FOR PAIN 30 tablet 1  . metFORMIN (GLUCOPHAGE) 500 MG tablet Take 1 tablet (500 mg total) by mouth 2 (two) times daily with meals for 90 days 180 tablet 1  . multivitamin tablet Take 1 tablet by mouth once daily    . RYBELSUS 3 mg tablet TAKE 1 TABLET (3 MG TOTAL) BY MOUTH ONCE DAILY DO NOT CUT, CRUSH, OR CHEW 30 tablet 1  . sodium, potassium, and magnesium (SUPREP) oral solution Take 2 Bottles (1 kit total) by mouth as directed One kit contains 2 bottles.  Take both bottles at the times instructed by your provider. 354 mL 0   No facility-administered encounter medications on file as of 05/29/2024.    Allergies as of 05/29/2024 - Reviewed 01/24/2024  Allergen Reaction Noted  . Tuberculin,ppd,multi-puncture Swelling 09/26/2017    No past medical history on file.  No past surgical history on file.  Vitals:   05/29/24 1101  BP: 132/84  Pulse: 90    Body mass index is 52.26 kg/m.  Appointment on 05/23/2024  Component Date Value Ref Range Status  .  WBC (White Blood Cell Count) 05/23/2024 6.2  4.1 - 10.2 10^3/uL Final  . RBC (Red Blood Cell Count) 05/23/2024 4.13  4.04 - 5.48 10^6/uL Final  . Hemoglobin 05/23/2024 11.3 (L)  12.0 - 15.0 gm/dL Final  . Hematocrit 92/76/7974 35.5  35.0 - 47.0 % Final  . MCV (Mean Corpuscular Volume) 05/23/2024 86.0  80.0 - 100.0 fl Final  . MCH (Mean Corpuscular Hemoglobin) 05/23/2024 27.4  27.0 - 31.2 pg Final  . MCHC (Mean Corpuscular Hemoglobin * 05/23/2024 31.8 (L)  32.0 - 36.0 gm/dL Final  . Platelet Count 05/23/2024 368  150 - 450 10^3/uL Final  . RDW-CV (Red Cell Distribution Widt* 05/23/2024 13.6  11.6 - 14.8 % Final  . MPV (Mean Platelet Volume) 05/23/2024 9.9  9.4 - 12.4 fl Final  . Neutrophils 05/23/2024 3.43  1.50 - 7.80 10^3/uL Final  . Lymphocytes 05/23/2024 2.17  1.00 - 3.60 10^3/uL Final  . Monocytes 05/23/2024 0.43  0.00 - 1.50  10^3/uL Final  . Eosinophils 05/23/2024 0.12  0.00 - 0.55 10^3/uL Final  . Basophils 05/23/2024 0.04  0.00 - 0.09 10^3/uL Final  . Neutrophil % 05/23/2024 55.4  32.0 - 70.0 % Final  . Lymphocyte % 05/23/2024 34.9  10.0 - 50.0 % Final  . Monocyte % 05/23/2024 6.9  4.0 - 13.0 % Final  . Eosinophil % 05/23/2024 1.9  1.0 - 5.0 % Final  . Basophil% 05/23/2024 0.6  0.0 - 2.0 % Final  . Immature Granulocyte % 05/23/2024 0.3  <=0.7 % Final  . Immature Granulocyte Count 05/23/2024 0.02  <=0.06 10^3/L Final  . Glucose 05/23/2024 151 (H)  70 - 110 mg/dL Final  . Sodium 92/76/7974 141  136 - 145 mmol/L Final  . Potassium 05/23/2024 4.5  3.6 - 5.1 mmol/L Final  . Chloride 05/23/2024 105  97 - 109 mmol/L Final  . Carbon Dioxide (CO2) 05/23/2024 29.4  22.0 - 32.0 mmol/L Final  . Urea Nitrogen (BUN) 05/23/2024 17  7 - 25 mg/dL Final  . Creatinine 92/76/7974 0.9  0.6 - 1.1 mg/dL Final  . Glomerular Filtration Rate (eGFR) 05/23/2024 71  >60 mL/min/1.73sq m Final   CKD-EPI (2021) does not include patient's race in the calculation of eGFR.  Monitoring changes of plasma creatinine and eGFR over time is useful for monitoring kidney function.   Interpretive Ranges for eGFR (CKD-EPI 2021):  eGFR:       >60 mL/min/1.73 sq. m - Normal eGFR:       30-59 mL/min/1.73 sq. m - Moderately Decreased eGFR:       15-29 mL/min/1.73 sq. m  - Severely Decreased eGFR:       < 15 mL/min/1.73 sq. m  - Kidney Failure    Note: These eGFR calculations do not apply in acute situations when eGFR is changing rapidly or patients on dialysis.  . Calcium 05/23/2024 9.3  8.7 - 10.3 mg/dL Final  . AST  92/76/7974 13  8 - 39 U/L Final  . ALT  05/23/2024 8  5 - 38 U/L Final  . Alk Phos (alkaline Phosphatase) 05/23/2024 77  34 - 104 U/L Final  . Albumin 05/23/2024 3.8  3.5 - 4.8 g/dL Final  . Bilirubin, Total 05/23/2024 0.4  0.3 - 1.2 mg/dL Final  . Protein, Total 05/23/2024 6.6  6.1 - 7.9 g/dL Final  . A/G Ratio 92/76/7974 1.4  1.0  - 5.0 gm/dL Final  . Hemoglobin J8R 05/23/2024 7.3 (H)  4.2 - 5.6 % Final  . Average  Blood Glucose (Calc) 05/23/2024 163  mg/dL Final  . Cholesterol, Total 05/23/2024 135  100 - 200 mg/dL Final  . Triglyceride 92/76/7974 88  35 - 199 mg/dL Final  . HDL (High Density Lipoprotein) Cho* 05/23/2024 57.3  35.0 - 85.0 mg/dL Final  . LDL Calculated 05/23/2024 60  0 - 130 mg/dL Final  . VLDL Cholesterol 05/23/2024 18  mg/dL Final  . Cholesterol/HDL Ratio 05/23/2024 2.4   Final  . Creatinine, Random Urine 05/23/2024 186.8  37.0 - 250.0 mg/dL Final  . Urine Albumin, Random 05/23/2024 15    mg/L Final  . Urine Albumin/Creatinine Ratio 05/23/2024 8.0  <30.0 ug/mg Final   Urine:         Spot collection              (g/mg creatinine)     Normal               < 30   Moderately          30-299         increased   Clinical             >=300 albuminuria  . Thyroid Stimulating Hormone (TSH) 05/23/2024 1.898  0.450-5.330 uIU/ml uIU/mL Final   Reference Range for Pregnant Females >= 18 yrs old: Normal Range for 1st trimester: 0.05-3.70 ulU/ml Normal Range for 2nd trimester: 0.31-4.35 ulU/ml  . Color 05/23/2024 Yellow  Colorless, Straw, Light Yellow, Yellow, Dark Yellow Final  . Clarity 05/23/2024 Clear  Clear Final  . Specific Gravity 05/23/2024 1.025  1.005 - 1.030 Final  . pH, Urine 05/23/2024 6.0  5.0 - 8.0 Final  . Protein, Urinalysis 05/23/2024 Negative  Negative mg/dL Final  . Glucose, Urinalysis 05/23/2024 Negative  Negative mg/dL Final  . Ketones, Urinalysis 05/23/2024 Negative  Negative mg/dL Final  . Blood, Urinalysis 05/23/2024 Negative  Negative Final  . Nitrite, Urinalysis 05/23/2024 Negative  Negative Final  . Leukocyte Esterase, Urinalysis 05/23/2024 3+ (!)  Negative Final  . Bilirubin, Urinalysis 05/23/2024 Negative  Negative Final  . Urobilinogen, Urinalysis 05/23/2024 0.2  0.2 - 1.0 mg/dL Final  . WBC, UA 92/76/7974 14 (H)  <=5 /hpf Final  . Red Blood Cells, Urinalysis  05/23/2024 1  <=3 /hpf Final  . Bacteria, Urinalysis 05/23/2024 0-5  0 - 5 /hpf Final  . Squamous Epithelial Cells, Urinaly* 05/23/2024 0  /hpf Final  . Ferritin 05/23/2024 174  11 - 307 ng/mL Final  . Urine Culture, Routine - [facility] 05/23/2024 Final report   Final  . Result 1 - [facility] 05/23/2024 Comment   Final   Mixed urogenital flora 25,000-50,000 colony forming units per mL   Wt Readings from Last 3 Encounters:  05/29/24 (!) 121.4 kg (267 lb 9.6 oz)  01/24/24 (!) 125.2 kg (276 lb)  08/31/23 (!) 124.3 kg (274 lb)  Blood pressure 132/84, pulse 90, height 152.4 cm (5'), weight (!) 121.4 kg (267 lb 9.6 oz), SpO2 97%. Body mass index is 52.26 kg/m.  SABRA Exam Body mass index is 52.26 kg/m. Morbidly Obese General: Alert oriented x3  Skin: No suspicious lesions or moles.   Eyes: Sclera and conjunctiva clear; pupils equal round and reactive to light; extraocular movements intact Ears: External ears and canal normal; tympanic membranes normal.   Nose: Mucosa healthy without drainage or ulceration Oropharynx:Normal  Neck:  carotid pulses normal bilaterally, thyroid normal size,  No bruits heard. Lungs: Respirations unlabored; clear to auscultation bilaterally Back: No spinal deformity Cardiovascular: Heart regular rate and  rhythm without murmurs, gallops, or rubs Abdomen: Soft; non tender; non distended;  no masses or organomegaly PELVIC; Declined Lymph Nodes: No significant cervical or supraclavicular lymphadenopathy noted Extremities:No edema  Varicose venus + bilateral Neurologic: Alert and oriented; speech intact; face symmetrical; moves all extremities well     Assessment and Plan:  1 Type 2 DM;On  Metformin 500 mg po twice a day.and Rybelsus  Last A1c; 7.3 Increase  Rybeslus to 7 mg po qd  Advised srict low carb diet. 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 Declines repeat Check cbc, Met-c, lipiods, ua A1c 1 week prior to next visit   Mammogram -ok -April 2025  Will have Colonoscopy in Sept 2025  Continue efforts at weight reduction  Schedule DEXA scan  No need for pap because of Hysterectomy 2004 for uterine Fibroids Labs 1 week prior to next visit  Follow up in 4 months    Madison Leventhal  MD  Madison Simmons is a 3 y.o. here for Medicare Wellness Visit  MEDICARE WELLNESS VISIT  Providers Rendering Care 1. Dr. Tamra Simmons (PCP) 2 Dr. Myrna ( Ophthalmology)   Functional Assessment (1) Hearing: Demonstrates difficulty in hearing during normal conversation Wears hearing aids (2) Risk of Falls: Patient denies any falls or near falls in the last year, Gait steady without assistance during walk from waiting area to exam room (3) Home Safety: Patient feels secure in their home, There are operational smoke alarms in multiple areas of the home (4) Activities of Daily Living: Independently manages personal grooming and household chores, including cooking, cleaning and laundry. Manages Personal finances without assistance.  Depression Screening PHQ 2/9 last 3 flowsheet values     12/01/2022   10:44 AM 01/24/2024   10:22 AM 05/29/2024   11:02 AM  PHQ-2/9 Depression Screening   Little interest or pleasure in doing things  0 0  Feeling down, depressed, or hopeless  0 0  Patient Health Questionnaire-2  Score  0 * 0  (OBSOLETE) Little interest or pleasure in doing things 0    (OBSOLETE) Feeling down, depressed, or hopeless (or irritable for Teens only)? 0    (OBSOLETE) Total Prescreening Score 0    (OBSOLETE) Total Score = 0      * Data saved with a previous flowsheet row definition     Depression Severity and Treatment Recommendations:  0-4= None  5-9= Mild / Treatment: Support, educate to call if worse; return in one month  10-14= Moderate / Treatment: Support, watchful waiting; Antidepressant or Psychotherapy  15-19= Moderately severe / Treatment: Antidepressant OR Psychotherapy  >= 20 = Major depression, severe / Antidepressant AND Psychotherapy On medications for Depression   Cognitive Impairment Patient denies episodes of loosing things, being forgetful. Seems oriented to person, place and time.  Responses appear appropriate and  timely to this observer.  PREVENTION PLAN  Cardiovascular: FLP assessed; 05/23/24; LDL; 60 Diabetes: A1c or FBG assessed; 7/23//25; A1c; 7.3 Glaucoma: N/A Hepatitis B (HBV) Vaccine:  Not Applicable Smoking Cessation: Advised to quit  Other Personalized Health Advice  Encouraged patient to exercise 5 days a week, walking, water aerobics, gentle stretching recommended. Increase dietary intake of fresh fruits and vegetables, reduce red meat to twice a week.  End of Life Counseling Patient has living will in place; Has designated her son and niece as her POA - ; Pt is DNR   Current Outpatient Medications  Medication Sig Dispense Refill  . atorvastatin (LIPITOR) 20 MG tablet Take 1 tablet (20 mg total) by mouth once daily 90 tablet 1  . ACCU-CHEK GUIDE GLUCOSE METER Misc 1 each as directed 1 each 0  . ACCU-CHEK GUIDE L1-L2 CTRL SOL Soln Use 1 each as directed 1 each 3  . ACCU-CHEK SOFT DEV LANCETS kit Use 1 each 2 (two) times daily Use as instructed. 200 each 3  . amLODIPine (NORVASC) 10 MG tablet Take 1 tablet (10 mg total) by mouth once daily 90  tablet 1  . bisoproloL-hydroCHLOROthiazide (ZIAC) 10-6.25 mg tablet TAKE 1 TABLET BY MOUTH EVERY DAY 30 tablet 2  . blood glucose diagnostic (ACCU-CHEK GUIDE TEST STRIPS) test strip 1 each (1 strip total) once daily 100 each 5  . blood glucose meter kit 2 (two) times daily 1 each 0  . cholecalciferol (VITAMIN D3) 1,000 unit capsule Take 2,000 Units by mouth once daily    . citalopram (CELEXA) 40 MG tablet TAKE 1 TABLET BY MOUTH EVERY DAY 30 tablet 5  . cyanocobalamin (VITAMIN B12) 1000 MCG tablet Take 2,000 mcg by mouth once daily    . gabapentin (NEURONTIN) 400 MG capsule Take 1 capsule (400 mg total) by mouth 2 (two) times daily 60 capsule 5  . meloxicam (MOBIC) 15 MG tablet TAKE 1 TABLET (15 MG TOTAL) BY MOUTH ONCE DAILY AS NEEDED FOR PAIN 30 tablet 1  . metFORMIN (GLUCOPHAGE) 500 MG tablet Take 1 tablet (500 mg total) by mouth 2 (two) times daily with meals for 90 days 180 tablet 1  . multivitamin tablet Take 1 tablet by mouth once daily    . RYBELSUS 3 mg tablet TAKE 1 TABLET (3 MG TOTAL) BY MOUTH ONCE DAILY DO NOT CUT, CRUSH, OR CHEW 30 tablet 1  . sodium, potassium, and magnesium (SUPREP) oral solution Take 2 Bottles (1 kit total) by mouth as directed One kit contains 2 bottles.  Take both bottles at the times instructed by your provider. 354 mL 0   No current facility-administered medications for this visit.    Allergies as of 05/29/2024 - Reviewed 01/24/2024  Allergen Reaction Noted  . Tuberculin,ppd,multi-puncture Swelling 09/26/2017    Patient Active Problem List  Diagnosis  . Essential hypertension, benign  . BMI 45.0-49.9, adult (CMS/HHS-HCC)  . Gastroesophageal reflux disease  . Moderate tobacco use disorder  . Type 2 diabetes mellitus with hyperglycemia, without long-term current use of insulin (CMS/HHS-HCC)  . Pure hypercholesterolemia  . Anemia  . History of 2019 novel coronavirus disease (COVID-19)  . Morbid obesity with BMI of 50.0-59.9, adult (CMS/HHS-HCC)    No  past medical history on file.  No past surgical history on file.  Health Maintenance  Topic Date Due  . DXA Bone Density Scan  Never done  . Pneumococcal Vaccine: 50+ (1 of 2 - PCV) Never done  . Colorectal Cancer Screening  06/29/2018  .  Medicare Intial Physical (IPPE) [phone]  Never done  . RSV Immunization Pregnant or 60+ (1 - Risk 60-74 years 1-dose series) 08/30/2024 (Originally 11/22/2018)  . Monofilament Foot Exam  07/07/2024  . Diabetes Education  07/07/2024  . Hemoglobin A1C  11/23/2024  . Annual Physical/Well Child Check  01/24/2025  . Mammogram  02/06/2025  . Diabetes Eye Assessment Exam  04/04/2025  . Potassium Level  05/23/2025  . Lipid Panel  05/23/2025  . Annual Urine Albumin Creatinine Ratio  05/23/2025  . Serum Calcium  05/23/2025  . Depression Screening  05/29/2025  . Hib Vaccines  Aged Out  . Hepatitis A Vaccines  Aged Out  . Meningococcal B Vaccine  Aged Out  . Meningococcal ACWY Vaccine  Aged Out  . HPV Vaccines  Aged Out  . COVID-19 Vaccine  Discontinued  . Pap Smear  Discontinued  . Adult Tetanus (Td And Tdap)  Discontinued  . Shingrix  Discontinued  . HIV Screen  Discontinued  . Hepatitis C Screen  Discontinued  . Influenza Vaccine  Discontinued    Vitals:   05/29/24 1101  BP: 132/84  Pulse: 90  SpO2: 97%  Weight: (!) 121.4 kg (267 lb 9.6 oz)  Height: 152.4 cm (5')  PainSc: 0-No pain   Body mass index is 52.26 kg/m.  Assessment/Plan  1. Medicare wellness visit- Medications and allergies reviewed. Copy of preventative health provided.  Labs reviewed.   Madison ETHELENE LEVENTHAL, MD  *Some images could not be shown.

## 2024-08-29 ENCOUNTER — Encounter: Payer: Self-pay | Admitting: Emergency Medicine

## 2024-08-29 ENCOUNTER — Other Ambulatory Visit: Payer: Self-pay

## 2024-08-29 ENCOUNTER — Emergency Department

## 2024-08-29 ENCOUNTER — Emergency Department
Admission: EM | Admit: 2024-08-29 | Discharge: 2024-08-29 | Disposition: A | Discharge status: Home / Self Care | Attending: Emergency Medicine | Admitting: Emergency Medicine

## 2024-08-29 DIAGNOSIS — I10 Essential (primary) hypertension: Secondary | ICD-10-CM | POA: Diagnosis not present

## 2024-08-29 DIAGNOSIS — M79602 Pain in left arm: Secondary | ICD-10-CM | POA: Diagnosis not present

## 2024-08-29 DIAGNOSIS — R519 Headache, unspecified: Secondary | ICD-10-CM | POA: Diagnosis present

## 2024-08-29 LAB — BASIC METABOLIC PANEL WITH GFR
Anion gap: 12 (ref 5–15)
BUN: 13 mg/dL (ref 8–23)
CO2: 25 mmol/L (ref 22–32)
Calcium: 8.9 mg/dL (ref 8.9–10.3)
Chloride: 103 mmol/L (ref 98–111)
Creatinine, Ser: 0.92 mg/dL (ref 0.44–1.00)
GFR, Estimated: >60 mL/min (ref >60)
Glucose, Bld: 156 mg/dL — ABNORMAL HIGH (ref 70–99)
Potassium: 3.8 mmol/L (ref 3.5–5.1)
Sodium: 140 mmol/L (ref 135–145)

## 2024-08-29 LAB — CBC
HCT: 36.6 % (ref 36.0–46.0)
Hemoglobin: 11.6 g/dL — ABNORMAL LOW (ref 12.0–15.0)
MCH: 27.2 pg (ref 26.0–34.0)
MCHC: 31.7 g/dL (ref 30.0–36.0)
MCV: 85.9 fL (ref 80.0–100.0)
Platelets: 368 K/uL (ref 150–400)
RBC: 4.26 MIL/uL (ref 3.87–5.11)
RDW: 13.7 % (ref 11.5–15.5)
WBC: 7.4 K/uL (ref 4.0–10.5)
nRBC: 0 % (ref 0.0–0.2)

## 2024-08-29 LAB — TROPONIN I (HIGH SENSITIVITY)
Troponin I (High Sensitivity): 3 ng/L (ref <18)
Troponin I (High Sensitivity): 3 ng/L (ref <18)

## 2024-08-29 MED ORDER — CLONIDINE HCL 0.1 MG PO TABS
0.1000 mg | ORAL_TABLET | Freq: Once | ORAL | Status: AC
  Administered 2024-08-29: 0.1 mg via ORAL
  Filled 2024-08-29: qty 1

## 2024-08-29 MED ORDER — KETOROLAC TROMETHAMINE 30 MG/ML IJ SOLN
30.0000 mg | Freq: Once | INTRAMUSCULAR | Status: AC
  Administered 2024-08-29: 30 mg via INTRAMUSCULAR
  Filled 2024-08-29: qty 1

## 2024-08-29 MED ORDER — NAPROXEN 500 MG PO TABS: 500.0000 mg | ORAL_TABLET | Freq: Two times a day (BID) | ORAL | 0 refills | Status: AC

## 2024-08-29 NOTE — ED Triage Notes (Signed)
 Pt via POV from home. Pt c/o L arm pain, started a week ago, worse today.  Denies injury. States the pain radiates to her shoulder. Denies cardiac hx. Pt has a hx of HTN and DM. Reports that she has had this before and prescribed Gabapentin but it has not helped this time. Pt is A&Ox4 and NAD, ambulatory to triage with steady gait.

## 2024-08-29 NOTE — Discharge Instructions (Signed)
 Please call and schedule follow-up appointment with your primary care provider.  Take your medication as prescribed.  Return to the emergency department for symptoms of concern if unable to see primary care.

## 2024-08-29 NOTE — ED Provider Notes (Signed)
 [facility] Provider Note    Event Date/Time   First MD Initiated Contact with Patient 08/29/24 1046     (approximate)   History   Arm Pain   HPI  Madison Simmons is a 63 y.o. female  with history of diabetes and as listed in EMR presents to the emergency department for treatment and evaluation of left arm pain that started about a week ago and has progressively worsened.  Pain started in the wrist and radiated up to the shoulder.  She has had similar pain in the past and has been taking gabapentin without relief. She is also experiencing a headache. She reports history of hypertension but no CAD.  She has not yet taken her blood pressure medications this morning.SABRA     Physical Exam    Vitals:   08/29/24 0954 08/29/24 1258  BP: (!) 155/114 (!) 141/78  Pulse: 80 63  Resp: 18 18  Temp: 98.8 F (37.1 C) 97.7 F (36.5 C)  SpO2: 100% 100%    General: Awake, no distress.  CV:  Good peripheral perfusion.  Resp:  Normal effort.  Abd:  No distention.  Other:  Pain in left arm increases with rotation of wrist.   ED Results / Procedures / Treatments   Labs (all labs ordered are listed, but only abnormal results are displayed)  Labs Reviewed  BASIC METABOLIC PANEL WITH GFR - Abnormal; Notable for the following components:      Result Value   Glucose, Bld 156 (*)    All other components within normal limits  CBC - Abnormal; Notable for the following components:   Hemoglobin 11.6 (*)    All other components within normal limits  TROPONIN I (HIGH SENSITIVITY)  TROPONIN I (HIGH SENSITIVITY)     EKG  Normal sinus rhythm with a rate of 75   RADIOLOGY  Image and radiology report reviewed and interpreted by me. Radiology report consistent with the same.  Chest x-ray negative for acute cardiopulmonary abnormality.  PROCEDURES:  Critical Care performed: No  Procedures   MEDICATIONS ORDERED IN ED:  Medications  ketorolac  (TORADOL ) 30  MG/ML injection 30 mg (30 mg Intramuscular Given 08/29/24 1146)  cloNIDine (CATAPRES) tablet 0.1 mg (0.1 mg Oral Given 08/29/24 1146)     IMPRESSION / MDM / ASSESSMENT AND PLAN / ED COURSE   I have reviewed the triage note and vital signs.  Hypertension noted   Differential diagnosis includes, but is not limited to, headache secondary to hypertension, general headache, migraine; carpal tunnel, radiculopathy, cardiac event.  Patient's presentation is most consistent with acute presentation with potential threat to life or bodily function.  63 year old female presenting to the emergency department for treatment and evaluation of left arm pain and headache.  See HPI for further details.  Exam is reassuring.  She is neurovascularly intact and has no neurological deficits.  She is noted to be hypertensive but has not yet taken her blood pressure medications today.  Plan will be to give her 0.1 mg clonidine and Toradol .  Chest x-ray, EKG, and labs are all reassuring.  Patient reports improvement of headache and arm pain after medications and blood pressure is now acceptable. She will be discharged home with ER return precautions and encouraged to follow up with primary care.      FINAL CLINICAL IMPRESSION(S) / ED DIAGNOSES   Final diagnoses:  Hypertension, unspecified type  Left arm pain     Rx / DC Orders   ED  Discharge Orders          Ordered    naproxen (NAPROSYN) 500 MG tablet  2 times daily with meals        08/29/24 1333             Note:  This document was prepared using Dragon voice recognition software and may include unintentional dictation errors.   Herlinda Kirk NOVAK, FNP 08/29/24 1426    Viviann Pastor, MD 08/29/24 [phone]

## 2024-09-18 ENCOUNTER — Emergency Department
Admission: EM | Admit: 2024-09-18 | Discharge: 2024-09-18 | Disposition: A | Discharge status: Home / Self Care | Attending: Emergency Medicine | Admitting: Emergency Medicine

## 2024-09-18 ENCOUNTER — Other Ambulatory Visit: Payer: Self-pay

## 2024-09-18 DIAGNOSIS — E119 Type 2 diabetes mellitus without complications: Secondary | ICD-10-CM | POA: Insufficient documentation

## 2024-09-18 DIAGNOSIS — I10 Essential (primary) hypertension: Secondary | ICD-10-CM | POA: Diagnosis not present

## 2024-09-18 DIAGNOSIS — H9201 Otalgia, right ear: Secondary | ICD-10-CM | POA: Diagnosis present

## 2024-09-18 MED ORDER — PREDNISONE 10 MG (21) PO TBPK: ORAL_TABLET | ORAL | 0 refills | Status: AC

## 2024-09-18 MED ORDER — ACETAMINOPHEN 325 MG PO TABS
650.0000 mg | ORAL_TABLET | Freq: Once | ORAL | Status: AC
  Administered 2024-09-18: 650 mg via ORAL

## 2024-09-18 MED ORDER — LIDOCAINE 4 % EX GEL: 1.0000 | Freq: Two times a day (BID) | CUTANEOUS | 0 refills | Status: AC

## 2024-09-18 MED ORDER — ACETAMINOPHEN 325 MG PO TABS
ORAL_TABLET | ORAL | Status: AC
  Filled 2024-09-18: qty 2

## 2024-09-18 MED ORDER — CETIRIZINE HCL 5 MG PO TABS: 5.0000 mg | ORAL_TABLET | Freq: Every day | ORAL | 2 refills | Status: AC

## 2024-09-18 NOTE — ED Provider Notes (Signed)
 [facility] Emergency Department Provider Note     Event Date/Time   First MD Initiated Contact with Patient 09/18/24 1755     (approximate)   History   Otalgia   HPI  Madison Simmons is a 63 y.o. female with a history of DM type II, HTN, and hearing deficit requiring hearing aids, who presents to the ED endorsing right earache since Friday.  Patient would endorse 4 days of intermittent symptoms but she believes onset happened after she inadvertently slept in her hearing aids.  She denies any tinnitus, vertigo, otorrhea, headache, dizziness, or vision changes.  No fevers, chills, or sweats reported.  She notes some fullness from the ear canal into the throat.  Physical Exam   Triage Vital Signs: ED Triage Vitals  Encounter Vitals Group     BP 09/18/24 1723 134/72     Girls Systolic BP Percentile --      Girls Diastolic BP Percentile --      Boys Systolic BP Percentile --      Boys Diastolic BP Percentile --      Pulse Rate 09/18/24 1723 80     Resp 09/18/24 1723 16     Temp 09/18/24 1723 98.5 F (36.9 C)     Temp Source 09/18/24 1723 Oral     SpO2 09/18/24 1723 98 %     Weight 09/18/24 1724 261 lb (118.4 kg)     Height 09/18/24 1724 5' 5 (1.651 m)     Head Circumference --      Peak Flow --      Pain Score 09/18/24 1723 8     Pain Loc --      Pain Education --      Exclude from Growth Chart --     Most recent vital signs: Vitals:   09/18/24 1723  BP: 134/72  Pulse: 80  Resp: 16  Temp: 98.5 F (36.9 C)  SpO2: 98%    General Awake, no distress. NAD HEENT NCAT. PERRL. EOMI. No rhinorrhea. Mucous membranes are moist.  Uvula is midline and tonsils are flat.  No oropharyngeal lesions noted.  TMs intact bilaterally without evidence of purulent or serous effusion.  Canals are clear without evidence of cerumen impaction or edema. CV:  Good peripheral perfusion.  RESP:  Normal effort.  ABD:  No distention.    ED Results / Procedures /  Treatments   Labs (all labs ordered are listed, but only abnormal results are displayed) Labs Reviewed - No data to display   EKG   RADIOLOGY  No results found.   PROCEDURES:  Critical Care performed: No  Procedures   MEDICATIONS ORDERED IN ED: Medications  acetaminophen  (TYLENOL ) tablet 650 mg (650 mg Oral Given 09/18/24 1727)     IMPRESSION / MDM / ASSESSMENT AND PLAN / ED COURSE  I reviewed the triage vital signs and the nursing notes.                              Differential diagnosis includes, but is not limited to, otalgia, AOM, TM rupture, otitis externa, eustachian tube dysfunction, URI  Patient's presentation is most consistent with acute, uncomplicated illness.  Patient's diagnosis is consistent with otalgia to the right ear without evidence of TM rupture.  Patient exam overall reassuring and no evidence of any acute infectious process.  Symptoms may represent a eustachian tube dysfunction including mild sinusitis.  Patient will  be discharged home with prescriptions for cetirizine , lidocaine  gel for topical use, and Pred pack. Patient is to follow up with her PCP as needed or otherwise directed. Patient is given ED precautions to return to the ED for any worsening or new symptoms.   FINAL CLINICAL IMPRESSION(S) / ED DIAGNOSES   Final diagnoses:  Right ear pain     Rx / DC Orders   ED Discharge Orders          Ordered    predniSONE  (STERAPRED UNI-PAK 21 TAB) 10 MG (21) TBPK tablet        09/18/24 1845    Lidocaine  4 % GEL  2 times daily        09/18/24 1853    cetirizine  (ZYRTEC ) 5 MG tablet  Daily        09/18/24 1853             Note:  This document was prepared using Dragon voice recognition software and may include unintentional dictation errors.    Loyd Candida LULLA Aldona, PA-C 09/18/24 2358    Bradler, Evan K, MD 09/22/24 [phone]

## 2024-09-18 NOTE — ED Triage Notes (Signed)
 Pt c/o R earache since Friday. Pt used homeopathic remedies without relief.

## 2024-09-18 NOTE — ED Notes (Signed)
 ED Provider at bedside.

## 2024-09-18 NOTE — Discharge Instructions (Addendum)
 Your ear exam is normal. Take the prescription meds as directed. Follow-up with your PCP as needed.
